# Patient Record
Sex: Female | Born: 1955
Health system: Southern US, Community
[De-identification: ages and names within clinical notes are randomized; demographics above are authoritative.]

## PROBLEM LIST (undated history)

## (undated) DIAGNOSIS — D219 Benign neoplasm of connective and other soft tissue, unspecified: Secondary | ICD-10-CM

## (undated) DIAGNOSIS — N200 Calculus of kidney: Secondary | ICD-10-CM

## (undated) DIAGNOSIS — IMO0002 Reserved for concepts with insufficient information to code with codable children: Secondary | ICD-10-CM

## (undated) DIAGNOSIS — K219 Gastro-esophageal reflux disease without esophagitis: Secondary | ICD-10-CM

## (undated) DIAGNOSIS — A630 Anogenital (venereal) warts: Secondary | ICD-10-CM

## (undated) DIAGNOSIS — N870 Mild cervical dysplasia: Secondary | ICD-10-CM

## (undated) HISTORY — DX: Calculus of kidney: N20.0

## (undated) HISTORY — PX: CERVICAL BIOPSY  W/ LOOP ELECTRODE EXCISION: SUR135

## (undated) HISTORY — DX: Mild cervical dysplasia: N87.0

## (undated) HISTORY — DX: Anogenital (venereal) warts: A63.0

## (undated) HISTORY — DX: Reserved for concepts with insufficient information to code with codable children: IMO0002

---

## 1989-11-15 HISTORY — PX: MYOMECTOMY: SHX85

## 1999-08-17 ENCOUNTER — Other Ambulatory Visit: Admission: RE | Admit: 1999-08-17 | Discharge: 1999-08-17 | Payer: Self-pay | Admitting: *Deleted

## 2000-02-10 ENCOUNTER — Other Ambulatory Visit: Admission: RE | Admit: 2000-02-10 | Discharge: 2000-02-10 | Payer: Self-pay | Admitting: *Deleted

## 2000-08-10 ENCOUNTER — Other Ambulatory Visit: Admission: RE | Admit: 2000-08-10 | Discharge: 2000-08-10 | Payer: Self-pay | Admitting: *Deleted

## 2000-08-15 ENCOUNTER — Encounter: Payer: Self-pay | Admitting: *Deleted

## 2000-08-15 ENCOUNTER — Encounter: Admission: RE | Admit: 2000-08-15 | Discharge: 2000-08-15 | Payer: Self-pay | Admitting: *Deleted

## 2001-02-06 ENCOUNTER — Other Ambulatory Visit: Admission: RE | Admit: 2001-02-06 | Discharge: 2001-02-06 | Payer: Self-pay | Admitting: *Deleted

## 2001-08-16 ENCOUNTER — Encounter: Payer: Self-pay | Admitting: *Deleted

## 2001-08-16 ENCOUNTER — Encounter: Admission: RE | Admit: 2001-08-16 | Discharge: 2001-08-16 | Payer: Self-pay | Admitting: *Deleted

## 2001-09-07 ENCOUNTER — Other Ambulatory Visit: Admission: RE | Admit: 2001-09-07 | Discharge: 2001-09-07 | Payer: Self-pay | Admitting: *Deleted

## 2001-11-03 ENCOUNTER — Encounter: Admission: RE | Admit: 2001-11-03 | Discharge: 2001-11-03 | Payer: Self-pay | Admitting: Endocrinology

## 2001-11-03 ENCOUNTER — Encounter: Payer: Self-pay | Admitting: Endocrinology

## 2002-08-17 ENCOUNTER — Encounter: Admission: RE | Admit: 2002-08-17 | Discharge: 2002-08-17 | Payer: Self-pay | Admitting: *Deleted

## 2002-08-17 ENCOUNTER — Encounter: Payer: Self-pay | Admitting: *Deleted

## 2002-09-10 ENCOUNTER — Other Ambulatory Visit: Admission: RE | Admit: 2002-09-10 | Discharge: 2002-09-10 | Payer: Self-pay | Admitting: *Deleted

## 2003-09-19 ENCOUNTER — Other Ambulatory Visit: Admission: RE | Admit: 2003-09-19 | Discharge: 2003-09-19 | Payer: Self-pay | Admitting: *Deleted

## 2003-10-01 ENCOUNTER — Encounter: Admission: RE | Admit: 2003-10-01 | Discharge: 2003-10-01 | Payer: Self-pay | Admitting: *Deleted

## 2003-11-16 DIAGNOSIS — N870 Mild cervical dysplasia: Secondary | ICD-10-CM

## 2003-11-16 HISTORY — DX: Mild cervical dysplasia: N87.0

## 2004-09-15 DIAGNOSIS — IMO0002 Reserved for concepts with insufficient information to code with codable children: Secondary | ICD-10-CM

## 2004-09-15 HISTORY — DX: Reserved for concepts with insufficient information to code with codable children: IMO0002

## 2004-09-23 ENCOUNTER — Other Ambulatory Visit: Admission: RE | Admit: 2004-09-23 | Discharge: 2004-09-23 | Payer: Self-pay | Admitting: *Deleted

## 2004-10-01 ENCOUNTER — Encounter: Admission: RE | Admit: 2004-10-01 | Discharge: 2004-10-01 | Payer: Self-pay | Admitting: *Deleted

## 2004-11-27 ENCOUNTER — Ambulatory Visit: Payer: Self-pay | Admitting: Internal Medicine

## 2005-05-12 ENCOUNTER — Other Ambulatory Visit: Admission: RE | Admit: 2005-05-12 | Discharge: 2005-05-12 | Payer: Self-pay | Admitting: *Deleted

## 2005-09-28 ENCOUNTER — Other Ambulatory Visit: Admission: RE | Admit: 2005-09-28 | Discharge: 2005-09-28 | Payer: Self-pay | Admitting: Obstetrics and Gynecology

## 2005-10-04 ENCOUNTER — Encounter: Admission: RE | Admit: 2005-10-04 | Discharge: 2005-10-04 | Payer: Self-pay | Admitting: Obstetrics and Gynecology

## 2006-02-15 ENCOUNTER — Ambulatory Visit: Payer: Self-pay | Admitting: Endocrinology

## 2006-09-19 ENCOUNTER — Ambulatory Visit: Payer: Self-pay | Admitting: Gastroenterology

## 2006-09-30 ENCOUNTER — Other Ambulatory Visit: Admission: RE | Admit: 2006-09-30 | Discharge: 2006-09-30 | Payer: Self-pay | Admitting: Obstetrics and Gynecology

## 2006-10-11 ENCOUNTER — Encounter: Admission: RE | Admit: 2006-10-11 | Discharge: 2006-10-11 | Payer: Self-pay | Admitting: Obstetrics and Gynecology

## 2007-08-05 ENCOUNTER — Encounter: Payer: Self-pay | Admitting: *Deleted

## 2007-08-05 DIAGNOSIS — D259 Leiomyoma of uterus, unspecified: Secondary | ICD-10-CM | POA: Insufficient documentation

## 2007-08-05 DIAGNOSIS — K219 Gastro-esophageal reflux disease without esophagitis: Secondary | ICD-10-CM | POA: Insufficient documentation

## 2007-10-06 ENCOUNTER — Other Ambulatory Visit: Admission: RE | Admit: 2007-10-06 | Discharge: 2007-10-06 | Payer: Self-pay | Admitting: Obstetrics and Gynecology

## 2007-10-17 ENCOUNTER — Encounter: Admission: RE | Admit: 2007-10-17 | Discharge: 2007-10-17 | Payer: Self-pay | Admitting: Obstetrics and Gynecology

## 2008-03-12 ENCOUNTER — Other Ambulatory Visit: Admission: RE | Admit: 2008-03-12 | Discharge: 2008-03-12 | Payer: Self-pay | Admitting: Obstetrics and Gynecology

## 2008-06-18 ENCOUNTER — Other Ambulatory Visit: Admission: RE | Admit: 2008-06-18 | Discharge: 2008-06-18 | Payer: Self-pay | Admitting: Obstetrics and Gynecology

## 2008-08-27 ENCOUNTER — Encounter: Payer: Self-pay | Admitting: Obstetrics and Gynecology

## 2008-08-27 ENCOUNTER — Inpatient Hospital Stay (HOSPITAL_COMMUNITY): Admission: RE | Admit: 2008-08-27 | Discharge: 2008-08-29 | Payer: Self-pay | Admitting: Obstetrics and Gynecology

## 2008-08-27 HISTORY — PX: TOTAL ABDOMINAL HYSTERECTOMY: SHX209

## 2008-10-29 ENCOUNTER — Encounter: Admission: RE | Admit: 2008-10-29 | Discharge: 2008-10-29 | Payer: Self-pay | Admitting: Obstetrics and Gynecology

## 2008-12-16 ENCOUNTER — Other Ambulatory Visit: Admission: RE | Admit: 2008-12-16 | Discharge: 2008-12-16 | Payer: Self-pay | Admitting: Obstetrics and Gynecology

## 2009-11-04 ENCOUNTER — Encounter: Admission: RE | Admit: 2009-11-04 | Discharge: 2009-11-04 | Payer: Self-pay | Admitting: Obstetrics and Gynecology

## 2010-08-28 ENCOUNTER — Ambulatory Visit: Payer: Self-pay | Admitting: Internal Medicine

## 2010-08-28 DIAGNOSIS — K432 Incisional hernia without obstruction or gangrene: Secondary | ICD-10-CM | POA: Insufficient documentation

## 2010-10-01 ENCOUNTER — Encounter: Payer: Self-pay | Admitting: Internal Medicine

## 2010-10-14 ENCOUNTER — Encounter: Admission: RE | Admit: 2010-10-14 | Discharge: 2010-10-14 | Payer: Self-pay | Admitting: General Surgery

## 2010-10-23 ENCOUNTER — Encounter: Payer: Self-pay | Admitting: Internal Medicine

## 2010-11-11 ENCOUNTER — Encounter
Admission: RE | Admit: 2010-11-11 | Discharge: 2010-11-11 | Payer: Self-pay | Source: Home / Self Care | Attending: Obstetrics and Gynecology | Admitting: Obstetrics and Gynecology

## 2010-12-15 NOTE — Assessment & Plan Note (Signed)
Summary: new pt/[employer]et[employer]/#/lb   Vital Signs:  Patient profile:   55 year old female Height:      67 inches Weight:      144.50 pounds BMI:     22.71 O2 Sat:      99 % on Room air Temp:     98.3 degrees F oral Pulse rate:   82 / minute BP sitting:   96 / 60  (left arm) Cuff size:   regular  Vitals Entered By: Zella Ball Ewing CMA Duncan Dull) (August 28, 2010 3:12 PM)  O2 Flow:  Room air  Preventive Care Screening  Colonoscopy:    Date:  11/16/2007    Results:  normal   CC: New Patient/RE   CC:  New Patient/RE.  History of Present Illness: here with ? hernia - has swollen area to right inguinal area, occasionally painful; tends to go down with lying flat and pushing or rubbing, and worse to standing up;  location is at the previous TAH scar.  Pt denies CP, worsening sob, doe, wheezing, orthopnea, pnd, worsening LE edema, palps, dizziness or syncope  Pt denies new neuro symptoms such as headache, facial or extremity weakness  Denies polyidpsia, polyuria  Preventive Screening-Counseling & Management  Alcohol-Tobacco     Smoking Status: never      Drug Use:  no.    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 father with colon polyp, died wtih CHF age 61, s?p MI brother with depression. ETOH, migraines aunt - ALS grandmother with stroke p-grandmother and 3 aunts iwth  breast cancer  Social History: Reviewed history and no changes required. 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   Head:  normocephalic and atraumatic.   Eyes:  vision grossly intact, pupils equal, and pupils round.   Ears:  R ear normal and L ear normal.   Nose:  no external deformity and no nasal discharge.   Mouth:  no gingival abnormalities and pharynx pink and moist.   Neck:  supple and no JVD.   Lungs:  normal respiratory effort and normal breath sounds.   Heart:  normal rate and regular rhythm.   Abdomen:  small 1-2 cm mild tender but easily reducible swollen area just above the low mid abd scar from previous hysterectomy Extremities:  no edema, no erythema    Impression & Recommendations:  Problem # 1:  INCISIONAL HERNIA (ICD-553.21) small in size and reducible but tender - for referral to gen surgury Orders: Surgical Referral (Surgery)  Patient Instructions: 1)  You will be contacted about the referral(s) to: General Surgury 2)  Please schedule a follow-up appointment in 6 months with CPX labs

## 2010-12-15 NOTE — Consult Note (Signed)
Summary: [facility]   Imported By: Lennie Odor 10/21/2010 11:47:52  _____________________________________________________________________  External Attachment:    Type:   Image     Comment:   External Document

## 2010-12-17 NOTE — Letter (Signed)
Summary: [facility]   Imported By: Sherian Rein 11/02/2010 12:08:19  _____________________________________________________________________  External Attachment:    Type:   Image     Comment:   External Document

## 2011-03-30 NOTE — Discharge Summary (Signed)
[name], [name]               ACCOUNT NO.:  [id number]   MEDICAL RECORD NO.:  [id number]          PATIENT TYPE:  INP   LOCATION:  1524                         FACILITY:  [facility]   PHYSICIAN:  Cynthia P. Romine, M.D.DATE OF BIRTH:  19-Jan-1956   DATE OF ADMISSION:  08/27/2008  DATE OF DISCHARGE:  08/29/2008                               DISCHARGE SUMMARY   DISCHARGE DIAGNOSES:  1. Recurrent cervical intraepithelial neoplasia.  2. Fibroids with menorrhagia.   HISTORY:  This is a 55 year old married white female gravida 0, para 0,  who originally had mild dysplasia in 2005 that was managed  conservatively, but then reoccurred on a path in November 2008 and  subsequent colposcopy she was found to have CIN-2.  She therefore  underwent LEEP procedure in November 24, 2007 which showed the CIN-3 with  clear margins.  Following her LEEP she had 2 subsequent normal Pap, but  recurrent ASCUS with positive high-risk HPV path came back in August  2009 and that combined with her known fibroids and menorrhagia caused  the patient to request definitive surgery for the menorrhagia and  hopefully definitive surgery for the recurrent dysplasia.   HOSPITAL COURSE:  On August 27, 2008 she was admitted and attempted a  robotic total laparoscopic hysterectomy, it was abandoned because the  cervix had been so foreshortened from her previous LEEP that the coring  would not sit on to the cervix.  There was however felt that it was  worth at least a laparoscopy to be sure that the colpotomy incision  could be done robotically with the coring as it sat.  She had  laparoscopy, there was very poor visibility of the uterus and  manipulation of the uterus was also very difficult and it was felt to be  on unwise to persist and attempts of robotic hysterectomy, therefore the  hysterectomy was converted to an abdominal hysterectomy.  This went  without complication.   ESTIMATED BLOOD LOSS:  200 mL.   DISPOSITION:  Postoperatively she did very well, and was sent home  afebrile and in good condition on postoperative day #2.   DISCHARGE INSTRUCTIONS:  She was given full discharge instructions  regarding pelvic rest and follow up in 4 weeks and discharge  prescription for Percocet.   PATHOLOGY:  Pathology did reveal focal low grade CIN-1 with no residual  high-grade squamous intraepithelial lesion and multiple fibroids.      Cynthia P. Romine, M.D.  Electronically Signed     CPR/MEDQ  D:  12/03/2008  T:  12/04/2008  Job:  [id number]

## 2011-03-30 NOTE — Op Note (Signed)
[name], [name]               ACCOUNT NO.:  [id number]   MEDICAL RECORD NO.:  [id number]          PATIENT TYPE:  AMB   LOCATION:  DAY                          FACILITY:  [facility]   PHYSICIAN:  Cynthia P. Romine, M.D.DATE OF BIRTH:  Jul 25, 1956   DATE OF PROCEDURE:  08/27/2008  DATE OF DISCHARGE:                               OPERATIVE REPORT   PREOPERATIVE DIAGNOSIS:  Recurrent cervical intraepithelial neoplasia,  fibroids and menorrhagia.   POSTOPERATIVE DIAGNOSES:  Recurrent cervical intraepithelial neoplasia,  fibroids and menorrhagia, path pending.   PROCEDURE:  Attempt at robotic laparoscopic hysterectomy, conversion to  open total abdominal hysterectomy.   Cynthia P. Romine, M.D.   ASSISTANT:  Dr. Leda Quail.   ANESTHESIA:  General endotracheal.   ESTIMATED BLOOD LOSS:  200 mL.   COMPLICATIONS:  None.   PROCEDURE:  The patient was taken to the operating room and after  induction of adequate general endotracheal anesthesia was placed in the  dorsal lithotomy position and prepped and draped in the usual fashion in  preparation for robotic surgery. A posterior weighted and anterior Sims  retractor were placed.  The cervix was noted to be foreshortened from  the previous LEEP and her vagina was very narrow. It was very difficult  to the get a Koh ring even to pass the introitus. After the cervix was  grasped with a single-tooth tenaculum on its anterior lip it was sounded  to 8 cm and dilated to a #23 Pratt.  An 8 cm Rumi was attached to the  handle and a medium Koh ring was put on and this would not pass through  the introitus.  The small Koh ring was then tried and with great  difficulty to be passed through the introitus but there was very little  cervix for it to surrounding and it was extraordinarily difficult to get  the Coolidge ring around the cervix and the manipulator into the uterus.  After multiple unsuccessful attempts by the surgeon, the assistant  surgeon  also tried and was unable to get a Rumi manipulator into the  cervix.  We then tried the [employer] and a small Koh ring and  with great effort this was able to be placed inside the vagina. Because  the cervix was so short the Tolna ring did not attach firmly to the  cervix.  However, the uterus did manipulate well and it was felt that we  should at least look in the abdomen with the laparoscope and see what we  thought about the ease of the colpotomy incision if this hysterectomy  was done robotically. Therefore attention was next turned to the  abdomen.  A small incision was made just above the umbilicus and carried  down to the fascia with sharp and blunt dissection.  The fascia was  elevated with Kocher's and entered. The underlying peritoneum was  entered atraumatically.  Hasson trocar was placed.  Pneumoperitoneum was  created with the automatic insufflator using CO2 and the robotic  laparoscope was introduced. Attention was first looked posteriorly in  the posterior cul-de-sac and it was felt that  the [facility] ring was visible  when the uterus was tilted anteriorly and that a posterior colpotomy  would be possible. Anteriorly for the cervix had been shorter.  We could  not see the [facility] ring and there was an anterior fibroid in the corpus  that also inhibited view of the cervix with the laparoscope.  Because of  poor visibility anteriorly and being unable to trust where the [facility] ring  was placed anteriorly in an effort to not have any surgical  complications from attempting a colpotomy when the [facility] ring was felt to  be unsatisfactory and delineating the cul-de-sac.  It was felt that open  hysterectomy was the best choice.  Therefore the laparoscope was  removed.  The fascia was closed around the umbilical incision.  The  umbilical incision was closed in the skin using 4-0 Vicryl Rapide.  A  Pfannenstiel incision was then made at the site of her previous  Pfannenstiel, carried down to  the fascia using Bovie.  The fascia was  nicked and opened transversely with Mayo scissors.  Kocher's were used  to grasp the fascial margins and with membranes separated and the rectus  muscles with sharp dissection and the Bovie.  Rectus muscles were  separated bluntly in the midline.  The underlying peritoneum was entered  atraumatically and extended vertically. The abdomen was explored.  The  liver edge was smooth.  The gallbladder was quite distended but  contained no stones.  There was no adenopathy in the pelvis. The tubes  and ovaries appeared normal.  The uterus had and anterior fibroid in the  corpus approximately 3 cm and the fundal fibroid approximately 4 cm.  A  Balfour retractor was placed.  The bowel was packed away with wet laps.  The uterus was elevated with long Kelly's at the cornu.  The round  ligaments were sutured with zero Vicryl and then divided with Bovie.  The anterior leaf of broad ligament was taken down sharply as was  posteriorly leaf.  The bladder was taken down sharply.  The uterine  arteries were skeletonized.  A window was created in the mesosalpinx and  the pedicle containing the utero-ovarian ligament and the tube was  clamped, cut and doubly tied the zero Vicryl on each side.  The uterine  arteries were then clamped, cut and doubly tied on each side.  The  hysterectomy continued down the cardinal ligament with straight Heaney  clamp, clamping, cutting and tying with zero Vicryl in sequence.  The  uterosacral ligaments were clamped with curved Heaney, cut and the  vagina was entered.  The pedicles containing the uterosacral ligaments  were held.  Specimen was removed with Jorgenson scissors.  The vaginal  margins were grasped with Kocher's.  Angle sutures were placed on the  right and left angles of the vagina.  The vaginal mucosa was then closed  with two interrupted sutures of zero Vicryl.  The pelvis was irrigated.  There were some small bleeders on  the peritoneal edges, otherwise the  pelvis was hemostatic.  The peritoneal edges were briefly cauterized  with the Bovie to achieve excellent hemostasis.  The ovaries were tied  up to the round ligament on each side.  The bowel was allowed to return  to its anatomic position.  The packs were removed.  The peritoneum was  closed with a running suture of 2-0 Vicryl.  A subfascial On-Q catheter  was placed percutaneously from the patient's right. The fascia was then  closed with a running suture of zero Vicryl running from each angle to  the midline.  Hemostasis was achieved with subcu tissue with the Bovie.  Percutaneous On-Q subcutaneous catheter was placed.  The skin was then  closed with a running suture of 4-0 Vicryl Rapid, Benzoin and Steri-  Strips.  An On-Q pump was primed with 10 mL of 1% Xylocaine into the  subfascial catheter and 5 mL of 1% Xylocaine into the subcutaneous  catheter.  It then was hooked to the On-Q pump which contained 0.25%  Marcaine.  Packs and drains:  Foley.  Sponge and instrument counts  correct x3.      Cynthia P. Romine, M.D.  Electronically Signed     CPR/MEDQ  D:  08/27/2008  T:  08/27/2008  Job:  [id number]

## 2011-04-02 NOTE — Assessment & Plan Note (Signed)
[facility]                           GASTROENTEROLOGY OFFICE NOTE   NAME:[name] [name] STANDLEE                      MRN:          [id number]  DATE:09/19/2006                            DOB:          10-02-1956    REASON FOR REFERRAL:  Dr. Everardo All asked me to evaluate [name] [name] regarding  question GERD symptoms.   HISTORY OF PRESENT ILLNESS:  [name] [name] is a very pleasant 55 year old  woman who has had at least 3-4 years of acid taste in her mouth, burning in  her chest especially after meals and when lying down at night.  She also  complains of belching a bit more frequently recently.  She tried Prilosec  for this 2-3 years ago, she took it for about a year.  She was taking it  approximately 1 hour before breakfast, did not notice a tremendous amount of  difference at that point.  More recently, she has been swallowing aloe vera  gel which she does believe helps her symptoms.   She has noticed certain foods such as onions, tomatoes, and spicy foods do  cause an upset stomach and maybe a worsening of this burning in her abdomen  and chest.   REVIEW OF SYSTEMS:  Notable for no GI bleeding, no dysphagia, and stable  weight.  The rest of her review of systems is essentially normal and is  available on her nursing intake sheet.   PAST MEDICAL HISTORY:  In 1990, fibroid uterine surgery.   CURRENT MEDICINES:  Birth control pills.   ALLERGIES:  No known drug allergies.   SOCIAL HISTORY:  Married, two step-children, nonsmoker, drinks alcohol  occasionally.   FAMILY HISTORY:  Father with colon polyps, no colon cancer in family.   PHYSICAL EXAMINATION:  VITAL SIGNS:  Weight 146 pounds, blood pressure  96/60, pulse 80.  CONSTITUTIONAL:  Generally well-appearing.  NEUROLOGIC:  Alert and oriented x3.  EYES:  Extraocular movements intact.  MOUTH:  Oropharynx moist, no lesions.  NECK:  Supple, no lymphadenopathy.  CARDIOVASCULAR:  Heart regular rate  and rhythm.  LUNGS:  Clear to auscultation bilaterally.  ABDOMEN:  Soft, nontender, nondistended, normal bowel sounds.  EXTREMITIES:  No lower extremity edema.  SKIN:  No rashes or lesions on visible extremities.   ASSESSMENT AND PLAN:  A 55 year old woman with likely gastroesophageal  reflux disease, family history of colon polyps.   Her symptoms do seem fairly classic for GERD.  She did try a PPI in the past  but probably was not taking it in correct relation with her food and so I  have recommended she begin taking Protonix 40 mg once daily.  She will take  this 20-30 minutes prior to her dinner meal, which is a very reliable decent-  sized meal for her.  I have also given her a GERD handout on foods to avoid.  I did recommend that we proceed with upper endoscopy since she has had this  symptom for 3-4 years, I think it is reasonable to screen her for Barrett's.  At the same time, I think proceeding with screening colonoscopy since  she  has a family history of colon polyps.  She prefers to wait on the  colonoscopy for now and also wanted to see how she does over the next couple  of months with renewed PPI.  If at that time in 2 months from otherwise she  is still bothered by symptoms then I think we certainly should proceed with  upper endoscopy and since she will be undergoing sedation, colonoscopy seems  reasonable at the same time.  She will get a CBC today and certainly, if she  is anemic, we may have to rethink these.    ______________________________  Rachael Fee, MD    DPJ/MedQ  DD: 09/19/2006  DT: 09/19/2006  Job #: [id number]   cc:   Gregary Signs A. Everardo All, MD

## 2011-08-17 LAB — CBC
HCT: 33.3 — ABNORMAL LOW
HCT: 41.9
Hemoglobin: 11.4 — ABNORMAL LOW
Hemoglobin: 14
MCHC: 33.5
MCHC: 34.3
MCV: 93
MCV: 93.7
Platelets: 199
Platelets: 201
RBC: 3.58 — ABNORMAL LOW
RBC: 4.47
RDW: 12.8
RDW: 12.8
WBC: 11.8 — ABNORMAL HIGH
WBC: 4.9

## 2011-08-17 LAB — HCG, SERUM, QUALITATIVE: Preg, Serum: NEGATIVE

## 2011-08-17 LAB — GLUCOSE, CAPILLARY: Glucose-Capillary: 162 — ABNORMAL HIGH

## 2011-10-06 ENCOUNTER — Other Ambulatory Visit: Payer: Self-pay | Admitting: Obstetrics and Gynecology

## 2011-10-06 DIAGNOSIS — Z1231 Encounter for screening mammogram for malignant neoplasm of breast: Secondary | ICD-10-CM

## 2011-11-24 ENCOUNTER — Ambulatory Visit
Admission: RE | Admit: 2011-11-24 | Discharge: 2011-11-24 | Disposition: A | Discharge status: Home / Self Care | Payer: Self-pay | Source: Ambulatory Visit | Attending: Obstetrics and Gynecology | Admitting: Obstetrics and Gynecology

## 2011-11-24 DIAGNOSIS — Z1231 Encounter for screening mammogram for malignant neoplasm of breast: Secondary | ICD-10-CM

## 2012-10-25 ENCOUNTER — Other Ambulatory Visit: Payer: Self-pay | Admitting: Internal Medicine

## 2012-10-25 DIAGNOSIS — Z1231 Encounter for screening mammogram for malignant neoplasm of breast: Secondary | ICD-10-CM

## 2012-11-16 ENCOUNTER — Encounter (HOSPITAL_BASED_OUTPATIENT_CLINIC_OR_DEPARTMENT_OTHER): Payer: Self-pay | Admitting: *Deleted

## 2012-11-16 ENCOUNTER — Emergency Department (HOSPITAL_BASED_OUTPATIENT_CLINIC_OR_DEPARTMENT_OTHER)
Admission: EM | Admit: 2012-11-16 | Discharge: 2012-11-16 | Disposition: A | Discharge status: Home / Self Care | Payer: Managed Care, Other (non HMO) | Attending: Emergency Medicine | Admitting: Emergency Medicine

## 2012-11-16 DIAGNOSIS — Z79899 Other long term (current) drug therapy: Secondary | ICD-10-CM | POA: Insufficient documentation

## 2012-11-16 DIAGNOSIS — R55 Syncope and collapse: Secondary | ICD-10-CM

## 2012-11-16 DIAGNOSIS — R112 Nausea with vomiting, unspecified: Secondary | ICD-10-CM | POA: Insufficient documentation

## 2012-11-16 DIAGNOSIS — R197 Diarrhea, unspecified: Secondary | ICD-10-CM

## 2012-11-16 LAB — COMPREHENSIVE METABOLIC PANEL
ALT: 15 U/L (ref 0–35)
Albumin: 3.8 g/dL (ref 3.5–5.2)
BUN: 15 mg/dL (ref 6–23)
CO2: 23 mEq/L (ref 19–32)
Calcium: 8.7 mg/dL (ref 8.4–10.5)
Chloride: 103 mEq/L (ref 96–112)
Creatinine, Ser: 0.9 mg/dL (ref 0.50–1.10)
GFR calc Af Amer: 82 mL/min — ABNORMAL LOW (ref >90)
GFR calc non Af Amer: 71 mL/min — ABNORMAL LOW (ref >90)
Glucose, Bld: 152 mg/dL — ABNORMAL HIGH (ref 70–99)
Potassium: 3.5 mEq/L (ref 3.5–5.1)
Sodium: 139 mEq/L (ref 135–145)
Total Bilirubin: 0.5 mg/dL (ref 0.3–1.2)

## 2012-11-16 LAB — TROPONIN I: Troponin I: <0.3 ng/mL (ref <0.30)

## 2012-11-16 LAB — CBC WITH DIFFERENTIAL/PLATELET
Basophils Absolute: 0 10*3/uL (ref 0.0–0.1)
Basophils Relative: 1 % (ref 0–1)
Eosinophils Absolute: 0.1 10*3/uL (ref 0.0–0.7)
Hemoglobin: 13.9 g/dL (ref 12.0–15.0)
Lymphocytes Relative: 25 % (ref 12–46)
MCHC: 33.7 g/dL (ref 30.0–36.0)
Monocytes Absolute: 0.6 10*3/uL (ref 0.1–1.0)
Monocytes Relative: 9 % (ref 3–12)
Neutro Abs: 4.4 10*3/uL (ref 1.7–7.7)
Neutrophils Relative %: 63 % (ref 43–77)
Platelets: 218 10*3/uL (ref 150–400)
RBC: 4.39 MIL/uL (ref 3.87–5.11)
RDW: 13 % (ref 11.5–15.5)
WBC: 6.9 10*3/uL (ref 4.0–10.5)

## 2012-11-16 MED ORDER — ONDANSETRON HCL 4 MG/2ML IJ SOLN
4.0000 mg | Freq: Once | INTRAMUSCULAR | Status: AC
  Administered 2012-11-16: 4 mg via INTRAVENOUS
  Filled 2012-11-16: qty 2

## 2012-11-16 MED ORDER — SODIUM CHLORIDE 0.9 % IV BOLUS (SEPSIS)
1000.0000 mL | Freq: Once | INTRAVENOUS | Status: AC
  Administered 2012-11-16: 1000 mL via INTRAVENOUS

## 2012-11-16 NOTE — ED Provider Notes (Signed)
History     CSN: [id number]  Arrival date & time 11/16/12  0911   First MD Initiated Contact with Patient 11/16/12 [phone]      Chief Complaint  Patient presents with  . Loss of Consciousness  . Emesis    (Consider location/radiation/quality/duration/timing/severity/associated sxs/prior treatment) HPI Patient at work this a.m. Then felt light headed, then felt urge to have bowel movement, had bowel movement, became light headed and passed out on commode and fell back hitting wall then was awake.  Coworker crawled under stall, then patient began vomiting food.  Vomited 7-8 times.  EMS arrived and gave zofran iv patient feels improved but still has some nausea and vomited 1-2 times here.  Continues to feel weak.  Patient in normal health yesterday.  Several weeks patient has had some lower abdominal tenderness.  Normal bowel movements prior to this a.m., no weight change, no fever, cough, headache, chest pain.  pmd Dr. Jonny Ruiz History reviewed. No pertinent past medical history. none History reviewed. No pertinent past surgical history. S/p hysterectom History reviewed. No pertinent family history.  History  Substance Use Topics  . Smoking status: Not on file  . Smokeless tobacco: Not on file  . Alcohol Use: Not on file    OB History    Grav Para Term Preterm Abortions TAB SAB Ect Mult Living                  Review of Systems  Constitutional: Negative.  Negative for fever and unexpected weight change.  HENT: Negative.   Eyes: Negative.   Respiratory: Negative.   Cardiovascular: Negative.   Gastrointestinal: Positive for nausea and diarrhea. Negative for blood in stool and anal bleeding.  Genitourinary: Negative.   Musculoskeletal: Negative.   Skin: Negative.   Neurological: Negative.   Hematological: Negative.   Psychiatric/Behavioral: Negative.     Allergies  Review of patient's allergies indicates no known allergies.  Home Medications   Current Outpatient Rx  Name   Route  Sig  Dispense  Refill  . ESTRADIOL 0.0375 MG/24HR TD PTWK   Transdermal   Place 1 patch onto the skin once a week.           BP 92/67  Pulse 72  Temp 97.6 F (36.4 C) (Oral)  Resp 16  Ht 5\' 7"  (1.702 m)  Wt 147 lb (66.679 kg)  BMI 23.02 kg/m2  SpO2 96%  Physical Exam  Nursing note and vitals reviewed. Constitutional: She appears well-developed and well-nourished.  HENT:  Head: Normocephalic and atraumatic.  Eyes: Conjunctivae normal and EOM are normal. Pupils are equal, round, and reactive to light.  Neck: Normal range of motion. Neck supple.  Cardiovascular: Normal rate, regular rhythm, normal heart sounds and intact distal pulses.   Pulmonary/Chest: Effort normal and breath sounds normal.  Abdominal: Soft. Bowel sounds are normal.  Musculoskeletal: Normal range of motion.  Neurological: She is alert.  Skin: Skin is warm and dry.  Psychiatric: She has a normal mood and affect. Thought content normal.    ED Course  Procedures (including critical care time)   Labs Reviewed  CBC WITH DIFFERENTIAL  COMPREHENSIVE METABOLIC PANEL  TROPONIN I  URINALYSIS, ROUTINE W REFLEX MICROSCOPIC   No results found.      Date: 11/16/2012  Rate: 62  Rhythm: normal sinus rhythm  QRS Axis: normal  Intervals: normal  ST/T Wave abnormalities: normal  Conduction Disutrbances: none  Narrative Interpretation: unremarkable     MDM  Patient states she  has had syncopal episodes consistent with fainting in the past. She became nauseated had vomiting and diarrhea followed by a syncopal episode today. And fever here. She feels greatly improved. She has a normal EKG and normal labs with the exception of a blood sugar of 152. She is to advise regarding this abnormality and will have that rechecked fasting with her primary care Dr.     Hilario Quarry, MD 11/16/12 1124

## 2012-11-16 NOTE — ED Notes (Signed)
Pt to room 5 by ems via stretcher. Able to move self from stretcher to bed, per ems pt had syncopal episode while using the bathroom at work along with vomiting. Iv est pta, zofran 4mg  administered by ems. Pt reports relief of nausea with zofran, states "I just feel lightheaded and weak..." denies any pain.

## 2012-11-30 ENCOUNTER — Emergency Department (HOSPITAL_COMMUNITY): Payer: Managed Care, Other (non HMO)

## 2012-11-30 ENCOUNTER — Emergency Department (HOSPITAL_COMMUNITY)
Admission: EM | Admit: 2012-11-30 | Discharge: 2012-11-30 | Disposition: A | Discharge status: Home / Self Care | Payer: Managed Care, Other (non HMO) | Attending: Emergency Medicine | Admitting: Emergency Medicine

## 2012-11-30 ENCOUNTER — Encounter (HOSPITAL_COMMUNITY): Payer: Self-pay | Admitting: *Deleted

## 2012-11-30 DIAGNOSIS — E876 Hypokalemia: Secondary | ICD-10-CM | POA: Insufficient documentation

## 2012-11-30 DIAGNOSIS — R0789 Other chest pain: Secondary | ICD-10-CM | POA: Diagnosis present

## 2012-11-30 DIAGNOSIS — R072 Precordial pain: Secondary | ICD-10-CM

## 2012-11-30 DIAGNOSIS — R071 Chest pain on breathing: Secondary | ICD-10-CM | POA: Insufficient documentation

## 2012-11-30 DIAGNOSIS — K219 Gastro-esophageal reflux disease without esophagitis: Secondary | ICD-10-CM

## 2012-11-30 HISTORY — DX: Gastro-esophageal reflux disease without esophagitis: K21.9

## 2012-11-30 HISTORY — DX: Benign neoplasm of connective and other soft tissue, unspecified: D21.9

## 2012-11-30 LAB — AMYLASE: Amylase: 102 U/L (ref 0–105)

## 2012-11-30 LAB — CBC
HCT: 40.9 % (ref 36.0–46.0)
Hemoglobin: 14.2 g/dL (ref 12.0–15.0)
MCHC: 34.7 g/dL (ref 30.0–36.0)
WBC: 6.1 10*3/uL (ref 4.0–10.5)

## 2012-11-30 LAB — LIPASE, BLOOD: Lipase: 34 U/L (ref 11–59)

## 2012-11-30 LAB — BASIC METABOLIC PANEL
BUN: 11 mg/dL (ref 6–23)
CO2: 25 mEq/L (ref 19–32)
Chloride: 103 mEq/L (ref 96–112)
Glucose, Bld: 84 mg/dL (ref 70–99)
Potassium: 3.3 mEq/L — ABNORMAL LOW (ref 3.5–5.1)

## 2012-11-30 LAB — POCT I-STAT TROPONIN I

## 2012-11-30 LAB — TROPONIN I: Troponin I: <0.3 ng/mL (ref <0.30)

## 2012-11-30 MED ORDER — FAMOTIDINE 20 MG PO TABS
40.0000 mg | ORAL_TABLET | Freq: Once | ORAL | Status: AC
  Administered 2012-11-30: 40 mg via ORAL
  Filled 2012-11-30: qty 2

## 2012-11-30 MED ORDER — GI COCKTAIL ~~LOC~~
30.0000 mL | Freq: Once | ORAL | Status: AC
  Administered 2012-11-30: 30 mL via ORAL
  Filled 2012-11-30: qty 30

## 2012-11-30 MED ORDER — ASPIRIN 81 MG PO CHEW
324.0000 mg | CHEWABLE_TABLET | Freq: Once | ORAL | Status: DC
  Filled 2012-11-30: qty 3

## 2012-11-30 MED ORDER — ASPIRIN 81 MG PO CHEW
243.0000 mg | CHEWABLE_TABLET | Freq: Once | ORAL | Status: AC
  Administered 2012-11-30: 243 mg via ORAL

## 2012-11-30 MED ORDER — POTASSIUM CHLORIDE CRYS ER 20 MEQ PO TBCR
40.0000 meq | EXTENDED_RELEASE_TABLET | Freq: Once | ORAL | Status: AC
  Administered 2012-11-30: 40 meq via ORAL
  Filled 2012-11-30: qty 2

## 2012-11-30 MED ORDER — FAMOTIDINE 20 MG PO TABS: 20.0000 mg | ORAL_TABLET | Freq: Two times a day (BID) | ORAL | Status: DC

## 2012-11-30 NOTE — ED Notes (Signed)
Patient transported to X-ray 

## 2012-11-30 NOTE — Consult Note (Signed)
CARDIOLOGY CONSULT NOTE   Patient ID: [name] MRN: [id number] DOB/AGE: 03-07-1956 57 y.o.  Admit date: 11/30/2012  Primary Physician   Oliver Barre, MD Primary Cardiologist   None Reason for Consultation   Chest pain  YQM:VHQIO [name] is a 57 y.o. female with no prior history of CAD. She has been having chest tightness for the last week. She states the pain at worst is a 4/10 but is constantly at least at a 1/10. She reports the pain radiates to her left arm. She states that eating seems to relieve some of the pain. She also reports sitting upright helps alleviate the pain. She denies increased pain on exertion or dyspnea. She rates the pain was still just 1/10 during her water aerobics class and during other exercise over the last week. She reports taking an ASA once at night 5 days ago to help relieve the pain. She denies any nausea or vomiting.  The pain awoke her this morning, and was the worst that it has been (4/10). She also reports having some heartburn and this morning. She came to the ER and had ASA 243 and Kdur 40 meq. Currently, her pain is a 1/10.   Past Medical History  Diagnosis Date  . GERD (gastroesophageal reflux disease)   . Fibroids      Past Surgical History  Procedure Date  . Total abdominal hysterectomy     No Known Allergies  I have reviewed the patient's current medications Prior to Admission medications   Medication Sig Start Date End Date Taking? Authorizing Provider  estradiol (CLIMARA - DOSED IN MG/24 HR) 0.0375 mg/24hr Place 1 patch onto the skin once a week.   Yes Historical Provider, MD     History   Social History  . Marital Status: Married    Spouse Name: N/A    Number of Children: N/A  . Years of Education: N/A   Occupational History  . SOFTWARE SUPPORT    Social History Main Topics  . Smoking status: Never Smoker   . Smokeless tobacco: Not on file  . Alcohol Use: 0.5 oz/week    1 drink(s) per week  . Drug Use: No  .  Sexually Active: Not on file   Other Topics Concern  . Not on file   Social History Narrative   No siblings with heart problems. Works in Company secretary.    Family Status  Relation Status Death Age  . Mother Alive     in her 41s, takes fluid pills  . Father Deceased 4    MI at age 62     ROS:  Full 14 point review of systems complete and found to be negative unless listed [name].  Physical Exam: Blood pressure 102/72, pulse 68, temperature 97.9 F (36.6 C), temperature source Oral, resp. rate 11, SpO2 100.00%.  General: Well developed, well nourished, female in no acute distress Head: Eyes PERRLA, No xanthomas.   Normocephalic and atraumatic, oropharynx without edema or exudate. Dentition: good Lungs: Clear bilaterally  Heart: HRRR S1 S2, no rub/gallop, no murmur. pulses are 2+ all 4 extrem.   Neck: No carotid bruits. No lymphadenopathy.  JVD not elevated. Abdomen: Bowel sounds present, abdomen soft and non-tender without masses or hernias noted. Msk:  No spine or cva tenderness. No weakness, no joint deformities or effusions. Extremities: No clubbing or cyanosis.  edema.  Neuro: Alert and oriented X 3. No focal deficits noted. Psych:  Good affect, responds appropriately Skin: No rashes  or lesions noted.  Labs:   Lab Results  Component Value Date   WBC 6.1 11/30/2012   HGB 14.2 11/30/2012   HCT 40.9 11/30/2012   MCV 92.1 11/30/2012   PLT 207 11/30/2012     Lab 11/30/12 0435  NA 138  K 3.3*  CL 103  CO2 25  BUN 11  CREATININE 0.89  CALCIUM 9.2  PROT --  BILITOT --  ALKPHOS --  ALT --  AST --  GLUCOSE 84     Basename 11/30/12 0853  CKTOTAL --  CKMB --  TROPONINI <0.30    Basename 11/30/12 0527  TROPIPOC 0.00    Lab Results  Component Value Date   DDIMER 0.38 11/30/2012   ECG: 30-Nov-2012 04:34:08 [facility]/ED ROUTINE RECORD Normal sinus rhythm Normal ECG 68mm/s 20mm/mV 100Hz  8.0.1 12SL 241 HD CID: 4 Referred by:  Unconfirmed Vent. rate 69 BPM PR interval 190 ms QRS duration 78 ms QT/QTc 420/450 ms P-[name]-T axes 78 84 78   Radiology:  Dg Chest 2 View 11/30/2012  *RADIOLOGY REPORT*  Clinical Data: Chest pain for 1 week.  CHEST - 2 VIEW  Comparison: None.  Findings: The heart size and pulmonary vascularity are normal. The lungs appear clear and expanded without focal air space disease or consolidation. No blunting of the costophrenic angles.  No pneumothorax.  Mediastinal contours appear intact.  IMPRESSION: No evidence of active pulmonary disease.   Original Report Authenticated By: Burman Nieves, M.D.     ASSESSMENT AND PLAN:   The patient was seen today by Dr Tenny Craw,  the patient evaluated and the data reviewed.  Principal Problem:  *Chest pain, mid sternal - atypical and possibly improved by PO intake, no association with exertion. No abdominal tenderness. Will check amylase and lipase, try GI cocktail, Pepcid. If labs OK and meds help, consider d/c home, to f/u as an outpatient.   Signed: [name] 11/30/2012, 1:09 PM Co-Sign MD  Patient seen and examined  Agree with findings of [name]. Patient has approx 1 week history of CP  Pain has been rel continuous at a very low level (1/10)  Has intermitt exacerbations that go to a 4/10  Exacerbations are not associated with SOB  Do not occur when she is doing physical activity  Improve when she eats. She did two work out sessions this week and actually felt better during these periods She does have a history of GERD  Takes Pepcid occasionally.  On exam:  Lung: CTA Cardiac:  RRR  Chest Nontender  Abdomen: benign Ext no edema  EKG with nonspecific ST changes in V1/V2 that have been there in past.  Impr:  Chest pain  Prob noncardiac, possiblY GI  Will check another troponin.  Check amylase and lipase.  Add GI cocktail and Pepcid BID  Continue ECASA 81 mg.   If blood work neg plan d/c from ER with  F/U in clinic to follow response.  Pt has  seen Arty Baumgartner in past  2.  Syncope  Sounds more vagal.  Had vomiting and diarrhea which may have exacerbated. Follow.  [name] 2:40 PM

## 2012-11-30 NOTE — ED Provider Notes (Signed)
History     CSN: [id number]  Arrival date & time 11/30/12  4098   First MD Initiated Contact with Patient 11/30/12 [phone]      Chief Complaint  Patient presents with  . Chest Pain    (Consider location/radiation/quality/duration/timing/severity/associated sxs/prior treatment) HPI [name] 57 year old female who presents the emergency department with chief complaint of left-sided chest pain.  Onset of her pain was one week.  She describes it as dull, aching, intermittent, pressure-like with radiation to the left shoulder.  Patient denies any recent heavy lifting or muscle strain.  Patient denies any upper respiratory symptoms.  Patient was seen and evaluated 14 days ago for a syncopal episode that occurred at work.  She passed out and woke up with vomiting.  She has had several episodes of syncope previously in her life and attributed this to anemia as a child.  The patient has never had cardiac evaluation.  She denies history of racing or skipping heart.  Patient is nondiabetic, no history of hypertension, no history of smoking.  She is not overweight or obese.  Family history of father with MI at age 55 and grandmother with stroke in her 86s.  Overall patient's risk factors appear to be minimal.  Patient has had resolution of her chest pain with administration of aspirin earlier this morning.  She also has a history of GERD which is untreated at this time.  Patient denies any associated nausea, vomiting, diaphoresis. History reviewed. No pertinent past medical history.  History reviewed. No pertinent past surgical history.  No family history on file.  History  Substance Use Topics  . Smoking status: Not on file  . Smokeless tobacco: Not on file  . Alcohol Use: Not on file    OB History    Grav Para Term Preterm Abortions TAB SAB Ect Mult Living                  Review of Systems Ten systems reviewed and are negative for acute change, except as noted in the HPI.  \ Allergies  Review of patient's allergies indicates no known allergies.  Home Medications   Current Outpatient Rx  Name  Route  Sig  Dispense  Refill  . ESTRADIOL 0.0375 MG/24HR TD PTWK   Transdermal   Place 1 patch onto the skin once a week.           BP 112/73  Pulse 70  Temp 97.9 F (36.6 C) (Oral)  SpO2 98%  Physical Exam Physical Exam  Nursing note and vitals reviewed. Constitutional: She is oriented to person, place, and time. She appears well-developed and well-nourished. No distress.  HENT:  Head: Normocephalic and atraumatic.  Eyes: Conjunctivae normal and EOM are normal. Pupils are equal, round, and reactive to light. No scleral icterus.  Neck: Normal range of motion.  Cardiovascular: Normal rate, regular rhythm and normal heart sounds.  Exam reveals no gallop and no friction rub.  No murmur heard. Pulmonary/Chest: Effort normal and breath sounds normal. No respiratory distress. No tenderness to palpation of the left chest wall or pectoral muscle. Abdominal: Soft. Bowel sounds are normal. She exhibits no distension and no mass. There is no tenderness. There is no guarding.  Neurological: She is alert and oriented to person, place, and time.  Skin: Skin is warm and dry. She is not diaphoretic.    ED Course  Procedures (including critical care time)  Labs Reviewed  BASIC METABOLIC PANEL - Abnormal; Notable for the following:  Potassium 3.3 (*)     GFR calc non Af Amer 72 (*)     GFR calc Af Amer 83 (*)     All other components within normal limits  CBC  D-DIMER, QUANTITATIVE  POCT I-STAT TROPONIN I   Dg Chest 2 View  11/30/2012  *RADIOLOGY REPORT*  Clinical Data: Chest pain for 1 week.  CHEST - 2 VIEW  Comparison: None.  Findings: The heart size and pulmonary vascularity are normal. The lungs appear clear and expanded without focal air space disease or consolidation. No blunting of the costophrenic angles.  No pneumothorax.  Mediastinal contours appear  intact.  IMPRESSION: No evidence of active pulmonary disease.   Original Report Authenticated By: Burman Nieves, M.D.     Date: 11/30/2012  Rate: 69  Rhythm: normal sinus rhythm  QRS Axis: normal  Intervals: normal  ST/T Wave abnormalities: normal  Conduction Disutrbances: none  Narrative Interpretation:   Old EKG Reviewed: No significant changes noted      1. Chest pain, mid sternal   2. Esophageal reflux       MDM  8:21 AM Filed Vitals:   11/30/12 0442  BP: 112/73  Pulse: 70  Temp: 97.9 F (36.6 C)    Patient here with complaint of chest pain.  I discussed the case with Dr. Ranae Palms.  Risk factors for acute coronary syndrome are low.  The patient does take estrogen replacement.  Her d-dimer however is negative and she denies any unilateral leg swelling, previous history of cancer, confinement, recent surgeries. Her labs show some mild hypokalemia.  Will obtain a second set of troponins.  Patient is comfortable and resting at this time.   4:06 PM BP 101/60  Pulse 67  Temp 98 F (36.7 C) (Oral)  Resp 16  SpO2 100%  I spoke with Theodore Demark PA from [facility].  Patient's labs are all negative.  She will be discharged with cardiac followup with our.  She will also be discharged with Pepcid 20 mg twice a day and follow up with her primary care physician.    Arthor Captain, PA-C 11/30/12 1606

## 2012-11-30 NOTE — ED Notes (Signed)
Pt arrived from via private car c/o intermittent CP that radiates to upper left arm. Pt denies diaphoresis, N/V, SOB and dizziness. CP exacerbated by nothing, CP eased by ASA. Last dose of ASA 81mg  15 minutes ago

## 2012-11-30 NOTE — ED Notes (Signed)
Pt st's she's on a hormone patch

## 2012-12-01 NOTE — ED Provider Notes (Signed)
Medical screening examination/treatment/procedure(s) were conducted as a shared visit with non-physician practitioner(s) and myself.  I personally evaluated the patient during the encounter   Loren Racer, MD 12/01/12 [phone]

## 2012-12-06 ENCOUNTER — Ambulatory Visit
Admission: RE | Admit: 2012-12-06 | Discharge: 2012-12-06 | Disposition: A | Discharge status: Home / Self Care | Payer: Managed Care, Other (non HMO) | Source: Ambulatory Visit | Attending: Internal Medicine | Admitting: Internal Medicine

## 2012-12-06 DIAGNOSIS — Z1231 Encounter for screening mammogram for malignant neoplasm of breast: Secondary | ICD-10-CM

## 2012-12-18 ENCOUNTER — Encounter: Payer: Managed Care, Other (non HMO) | Admitting: Internal Medicine

## 2013-09-13 ENCOUNTER — Encounter: Payer: Self-pay | Admitting: Certified Nurse Midwife

## 2013-09-13 ENCOUNTER — Ambulatory Visit (INDEPENDENT_AMBULATORY_CARE_PROVIDER_SITE_OTHER): Payer: Managed Care, Other (non HMO) | Admitting: Certified Nurse Midwife

## 2013-09-13 ENCOUNTER — Telehealth: Payer: Self-pay | Admitting: Internal Medicine

## 2013-09-13 ENCOUNTER — Telehealth: Payer: Self-pay | Admitting: Obstetrics and Gynecology

## 2013-09-13 VITALS — BP 108/72 | Temp 98.2°F | Resp 18 | Wt 151.0 lb

## 2013-09-13 DIAGNOSIS — N390 Urinary tract infection, site not specified: Secondary | ICD-10-CM

## 2013-09-13 DIAGNOSIS — R300 Dysuria: Secondary | ICD-10-CM

## 2013-09-13 LAB — POCT URINALYSIS DIPSTICK
Leukocytes, UA: NEGATIVE
pH, UA: >=9

## 2013-09-13 MED ORDER — NITROFURANTOIN MONOHYD MACRO 100 MG PO CAPS: 100.0000 mg | ORAL_CAPSULE | Freq: Two times a day (BID) | ORAL | Status: DC

## 2013-09-13 MED ORDER — FLUCONAZOLE 150 MG PO TABS: 150.0000 mg | ORAL_TABLET | Freq: Once | ORAL | Status: DC

## 2013-09-13 NOTE — Telephone Encounter (Signed)
Chief Complaint  Patient presents with   Appointment  pt has a uti and would like to be seen today if possible. i checked schedule and didn't see anything.

## 2013-09-13 NOTE — Telephone Encounter (Signed)
Return call to patient, VM has number confirmation, LM that appointment available at 4pm with Debbi. Please call back to confirm.

## 2013-09-13 NOTE — Telephone Encounter (Signed)
Ok to see me later today if open appt, or regina now, then make appt at a later date with me for "full re-establishment"

## 2013-09-13 NOTE — Telephone Encounter (Signed)
Debbi, scheduled for today at 4pm.

## 2013-09-13 NOTE — Progress Notes (Signed)
57 y.o. Married Caucasian female G0P0000 here with complaint of urinary frequency and urgency for 2-3 days.Denies fever, chills or headache. Complaining of some backache on right side. Patient also has treated self for yeast with OTC one day which symptoms have resolved.. Patient has been off antibiotics for 1 week, used for root canal. No other health issues. No history of kidney stones.   O: Healthy WD,WN female Affect: normal, orientation x 3 Skin:Warm and dry Abdomen:Positive suprapubic CVAT positive on right Pelvic exam:EXTERNAL GENITALIA: normal appearing vulva with no masses, tenderness or lesions Bladder, urethra, urethral meatus all tender and meatus red VAGINA: no abnormal discharge or lesions and medication residue noted CERVIX:  absent UTERUS: absent ADNEXA: no masses palpable, non tender  RECTUM: exam not indicated,   POCT urine: Blood present  A:UTI Yeast vaginitis? Treated with OTC, no wet prep taken    P: Discussed findings of UTI and need for treatment Rx Macrobid see order. Increase water intake and decrease soda, tea, coffee. Rx Diflucan see order  Patient will take if symptoms persist  Labs Urine culture/micro  RV prn

## 2013-09-13 NOTE — Telephone Encounter (Signed)
Pt request to reestablish, last ov 08/2010. Pt stated that she has UTI and need to been seen for this problem. Please advise. Pt has Community education officer for insurance.

## 2013-09-13 NOTE — Telephone Encounter (Signed)
Pt went to her OBGYN today.  She made an appt in Dec. To re-est and physical.

## 2013-09-13 NOTE — Telephone Encounter (Signed)
Patient called and confirmed the appt at 4 today with debbi

## 2013-09-13 NOTE — Telephone Encounter (Signed)
Ok , thanks, will let Harriett Sine know

## 2013-09-14 LAB — URINALYSIS, MICROSCOPIC ONLY
Bacteria, UA: NONE SEEN
Crystals: NONE SEEN

## 2013-09-14 NOTE — Progress Notes (Signed)
Note reviewed, agree with plan.  Keirstan Iannello, MD  

## 2013-09-18 NOTE — Progress Notes (Signed)
agree

## 2013-09-19 ENCOUNTER — Other Ambulatory Visit: Payer: Self-pay | Admitting: Certified Nurse Midwife

## 2013-09-19 DIAGNOSIS — N390 Urinary tract infection, site not specified: Secondary | ICD-10-CM

## 2013-09-21 ENCOUNTER — Telehealth: Payer: Self-pay | Admitting: Certified Nurse Midwife

## 2013-09-21 ENCOUNTER — Ambulatory Visit (INDEPENDENT_AMBULATORY_CARE_PROVIDER_SITE_OTHER): Payer: Managed Care, Other (non HMO) | Admitting: Certified Nurse Midwife

## 2013-09-21 ENCOUNTER — Encounter: Payer: Self-pay | Admitting: Certified Nurse Midwife

## 2013-09-21 VITALS — BP 94/62 | HR 76 | Temp 98.2°F | Resp 16 | Wt 153.0 lb

## 2013-09-21 DIAGNOSIS — N390 Urinary tract infection, site not specified: Secondary | ICD-10-CM

## 2013-09-21 DIAGNOSIS — R300 Dysuria: Secondary | ICD-10-CM

## 2013-09-21 DIAGNOSIS — B373 Candidiasis of vulva and vagina: Secondary | ICD-10-CM

## 2013-09-21 LAB — POCT URINALYSIS DIPSTICK
Blood, UA: 50
Glucose, UA: NEGATIVE
Nitrite, UA: NEGATIVE
Protein, UA: NEGATIVE
Urobilinogen, UA: NEGATIVE

## 2013-09-21 MED ORDER — PHENAZOPYRIDINE HCL 200 MG PO TABS: 200.0000 mg | ORAL_TABLET | Freq: Three times a day (TID) | ORAL | Status: DC | PRN

## 2013-09-21 MED ORDER — NYSTATIN-TRIAMCINOLONE 100000-0.1 UNIT/GM-% EX OINT: 1.0000 "application " | TOPICAL_OINTMENT | Freq: Two times a day (BID) | CUTANEOUS | Status: DC

## 2013-09-21 NOTE — Telephone Encounter (Signed)
Patient with continued back pain/dysuria/frequency and completed abx.  No fevers or chills. Office visit scheduled for today.

## 2013-09-21 NOTE — Progress Notes (Signed)
57 y.o. Married Caucasian female G0P0000 here for follow up of UTI treated with Macrobid initiated on October 30/14. Completed all medication as directed.  Denies any symptoms of urinary urgency or pain. Complaining of irritation in vulva area when urine touches skin. Still feel like she has some low back pain on right, but no intermittent pain. Denies muscle strain or problems.Denies abdominal pain, fever or chills. Took Diflucan due to history or yeast infection. "Feels OK" just not sure resolved.   O: Healthy WD,WN female Affect: normal  Skin:warm and dry Abdomen:suprapubic negative, abdomen soft no rebound CVAT negative bilateral, patient points to lower right hip area as area of discomfort when occurs. Pelvic exam:EXTERNAL GENITALIA: vulva red, slightly edema, and tenderness, BUS negative, bladder,urethral meatus non tender Wet prep taken VAGINA: no abnormal discharge or lesions and moist wet prep taken ph 4.0 CERVIX: absent UTERUS: absent ADNEXA: no masses palpable, nontender and adnexa not present RECTUM: no lesions  POCT Urine: blood 50  Wet Prep: negative yeast vaginally, positive yeast on vulva  A:UTI questionable Resolved  History of microhematuria with negative urology work up Yeast vulvitis   P: Discussed findings of questionable resolving UTI and will do repeat culture to see if cleared. Exam and urine do not indicate UTI. Discussed Pyridium use for frequency to see if resolves discomfort. Patient agreeable. Rx Pyridium see order Reviewed yeast findings: Rx Mycolog see order Lab: Urine micro/culture  Rv prn

## 2013-09-21 NOTE — Telephone Encounter (Signed)
Patient was in on the 30th for a uti and finished medication yesterday but doesn't feel like the uti is gone.

## 2013-09-22 LAB — URINALYSIS, MICROSCOPIC ONLY
Bacteria, UA: NONE SEEN
Crystals: NONE SEEN

## 2013-09-22 LAB — URINE CULTURE

## 2013-09-23 NOTE — Progress Notes (Signed)
Note reviewed, agree with plan.  Rosalinda Seaman, MD  

## 2013-09-28 ENCOUNTER — Other Ambulatory Visit (INDEPENDENT_AMBULATORY_CARE_PROVIDER_SITE_OTHER): Payer: Managed Care, Other (non HMO)

## 2013-09-28 ENCOUNTER — Encounter: Payer: Self-pay | Admitting: Internal Medicine

## 2013-09-28 ENCOUNTER — Ambulatory Visit (INDEPENDENT_AMBULATORY_CARE_PROVIDER_SITE_OTHER): Payer: Managed Care, Other (non HMO) | Admitting: Internal Medicine

## 2013-09-28 VITALS — BP 108/78 | HR 103 | Temp 97.7°F | Ht 67.0 in | Wt 149.2 lb

## 2013-09-28 DIAGNOSIS — N760 Acute vaginitis: Secondary | ICD-10-CM

## 2013-09-28 DIAGNOSIS — M545 Low back pain, unspecified: Secondary | ICD-10-CM

## 2013-09-28 DIAGNOSIS — R3915 Urgency of urination: Secondary | ICD-10-CM

## 2013-09-28 DIAGNOSIS — Z0000 Encounter for general adult medical examination without abnormal findings: Secondary | ICD-10-CM

## 2013-09-28 LAB — HEPATIC FUNCTION PANEL
ALT: 19 U/L (ref 0–35)
AST: 28 U/L (ref 0–37)
Albumin: 4.1 g/dL (ref 3.5–5.2)
Bilirubin, Direct: 0.1 mg/dL (ref 0.0–0.3)
Total Protein: 7.4 g/dL (ref 6.0–8.3)

## 2013-09-28 LAB — CBC WITH DIFFERENTIAL/PLATELET
Basophils Absolute: 0 10*3/uL (ref 0.0–0.1)
Basophils Relative: 0.4 % (ref 0.0–3.0)
Eosinophils Relative: 1.3 % (ref 0.0–5.0)
Lymphocytes Relative: 18.9 % (ref 12.0–46.0)
MCHC: 34 g/dL (ref 30.0–36.0)
MCV: 92.2 fl (ref 78.0–100.0)
Monocytes Absolute: 0.5 10*3/uL (ref 0.1–1.0)
Monocytes Relative: 8.7 % (ref 3.0–12.0)
Neutrophils Relative %: 70.7 % (ref 43.0–77.0)
Platelets: 220 10*3/uL (ref 150.0–400.0)
RDW: 13.3 % (ref 11.5–14.6)
WBC: 5.4 10*3/uL (ref 4.5–10.5)

## 2013-09-28 LAB — BASIC METABOLIC PANEL
BUN: 11 mg/dL (ref 6–23)
CO2: 29 mEq/L (ref 19–32)
Chloride: 103 mEq/L (ref 96–112)
Glucose, Bld: 83 mg/dL (ref 70–99)
Potassium: 4.2 mEq/L (ref 3.5–5.1)
Sodium: 137 mEq/L (ref 135–145)

## 2013-09-28 LAB — URINALYSIS, ROUTINE W REFLEX MICROSCOPIC
Bilirubin Urine: NEGATIVE
Leukocytes, UA: NEGATIVE
Nitrite: NEGATIVE
Specific Gravity, Urine: 1.01 (ref 1.000–1.030)
Total Protein, Urine: NEGATIVE
Urobilinogen, UA: 0.2 (ref 0.0–1.0)
pH: 6.5 (ref 5.0–8.0)

## 2013-09-28 LAB — LIPID PANEL
Cholesterol: 192 mg/dL (ref 0–200)
VLDL: 10.4 mg/dL (ref 0.0–40.0)

## 2013-09-28 LAB — TSH: TSH: 2.66 u[IU]/mL (ref 0.35–5.50)

## 2013-09-28 NOTE — Patient Instructions (Signed)
Please continue all other medications as before, and refills have been done if requested. Please have the pharmacy call with any other refills you may need. Please continue your efforts at being more active, low cholesterol diet, and weight control. You are otherwise up to date with prevention measures today. Please consider follow up with GYN, and consider referral to urology as well OK to take alleve as needed for the lower back pain Please go to the LAB in the Basement (turn left off the elevator) for the tests to be done today You will be contacted by phone if any changes need to be made immediately.  Otherwise, you will receive a letter about your results with an explanation, but please check with MyChart first.  Please remember to sign up for My Chart if you have not done so, as this will be important to you in the future with finding out test results, communicating by private email, and scheduling acute appointments online when needed.  Please return in 1 year for your yearly visit, or sooner if needed, with Lab testing done 3-5 days before

## 2013-09-28 NOTE — Progress Notes (Signed)
Subjective:    Patient ID: [name], female    DOB: 06-Oct-1956, 57 y.o.   MRN: [id number]  HPI  Here for wellness and f/u;  Overall doing ok;  Pt denies CP, worsening SOB, DOE, wheezing, orthopnea, PND, worsening LE edema, palpitations, dizziness or syncope.  Pt denies neurological change such as new headache, facial or extremity weakness.  Pt denies polydipsia, polyuria, or low sugar symptoms. Pt states overall good compliance with treatment and medications, good tolerability, and has been trying to follow lower cholesterol diet.  Pt denies worsening depressive symptoms, suicidal ideation or panic. No fever, night sweats, wt loss, loss of appetite, or other constitutional symptoms.  Pt states good ability with ADL's, has low fall risk, home safety reviewed and adequate, no other significant changes in hearing or vision, and only occasionally active with exercise.  Pt continues to have recurring right LBP without change in severity, bowel or bladder change, fever, wt loss,  worsening LE pain/numbness/weakness, gait change or falls. Also with recent root canal with antibx, then UTI for antibx, then fungal vaginitis tx, now with tender/irritation to vulva persists.  Also with some urinary freq but Denies urinary symptoms such as dysuria, urgency, flank pain, hematuria or n/v, fever, chills. Past Medical History  Diagnosis Date  . GERD (gastroesophageal reflux disease)   . Fibroids   . CIN I (cervical intraepithelial neoplasia I) 2005  . Condyloma   . ASCUS with positive high risk HPV 09/2004   Past Surgical History  Procedure Laterality Date  . Myomectomy  1991  . Cervical biopsy  w/ loop electrode excision      CIN 3-Clear margins   . Total abdominal hysterectomy  08/27/2008    Aborted R TLH-TAH ov retained; fibroids recurrent CIN, path-fibroids, CIN I    reports that she has never smoked. She has never used smokeless tobacco. She reports that she drinks about 0.5 ounces of alcohol per  week. She reports that she does not use illicit drugs. family history includes Congestive Heart Failure in her father; Hypertension in her mother. No Known Allergies Current Outpatient Prescriptions on File Prior to Visit  Medication Sig Dispense Refill  . estradiol (CLIMARA - DOSED IN MG/24 HR) 0.0375 mg/24hr Place 1 patch onto the skin once a week.      . famotidine (PEPCID) 20 MG tablet Take 1 tablet (20 mg total) by mouth 2 (two) times daily.  10 tablet  0  . nystatin-triamcinolone ointment (MYCOLOG) Apply 1 application topically 2 (two) times daily. For 5 days  30 g  0   No current facility-administered medications on file prior to visit.    Review of Systems Constitutional: Negative for diaphoresis, activity change, appetite change or unexpected weight change.  HENT: Negative for hearing loss, ear pain, facial swelling, mouth sores and neck stiffness.   Eyes: Negative for pain, redness and visual disturbance.  Respiratory: Negative for shortness of breath and wheezing.   Cardiovascular: Negative for chest pain and palpitations.  Gastrointestinal: Negative for diarrhea, blood in stool, abdominal distention or other pain Genitourinary: Negative for hematuria, flank pain or change in urine volume.  Musculoskeletal: Negative for myalgias and joint swelling.  Skin: Negative for color change and wound.  Neurological: Negative for syncope and numbness. other than noted Hematological: Negative for adenopathy.  Psychiatric/Behavioral: Negative for hallucinations, self-injury, decreased concentration and agitation.      Objective:   Physical Exam BP 108/78  Pulse 103  Temp(Src) 97.7 F (36.5 C) (Oral)  Ht 5\' 7"  (1.702 m)  Wt 149 lb 4 oz (67.699 kg)  BMI 23.37 kg/m2  SpO2 98% VS noted,  Constitutional: Pt is oriented to person, place, and time. Appears well-developed and well-nourished.  Head: Normocephalic and atraumatic.  Right Ear: External ear normal.  Left Ear: External ear  normal.  Nose: Nose normal.  Mouth/Throat: Oropharynx is clear and moist.  Eyes: Conjunctivae and EOM are normal. Pupils are equal, round, and reactive to light.  Neck: Normal range of motion. Neck supple. No JVD present. No tracheal deviation present.  Cardiovascular: Normal rate, regular rhythm, normal heart sounds and intact distal pulses.   Pulmonary/Chest: Effort normal and breath sounds normal.  Abdominal: Soft. Bowel sounds are normal. There is no tenderness. No HSM  Musculoskeletal: Normal range of motion. Exhibits no edema.  Lymphadenopathy:  Has no cervical adenopathy.  Neurological: Pt is alert and oriented to person, place, and time. Pt has normal reflexes. No cranial nerve deficit.  Skin: Skin is warm and dry. No rash noted.  Psychiatric:  Has  normal mood and affect. Behavior is normal.  Spine nontender, does have right lumbar paravertebral tender/soreness Pelvic - deferred    Assessment & Plan:

## 2013-09-28 NOTE — Assessment & Plan Note (Signed)
Recent cx neg, urged f/u with urology, declines for now

## 2013-09-28 NOTE — Assessment & Plan Note (Signed)
C/w prob underlying lumbar DJD or DDD, offered films to assess but delcine, neuro no change, for alleve prn

## 2013-09-28 NOTE — Assessment & Plan Note (Signed)

## 2013-09-28 NOTE — Assessment & Plan Note (Signed)
Hx sounds like vulvitis not better with recent antibx, antifungal, would need to consider steroid tx I think, urged return to GYN

## 2013-09-28 NOTE — Progress Notes (Signed)
Pre-visit discussion using our clinic review tool. No additional management support is needed unless otherwise documented below in the visit note.  

## 2013-10-03 ENCOUNTER — Ambulatory Visit: Payer: Managed Care, Other (non HMO)

## 2013-10-03 NOTE — Telephone Encounter (Signed)
Pt DNKA FOR LAB TODAY. CALL TO PT LMTCB TO RESCHEDULE

## 2013-10-31 ENCOUNTER — Encounter: Payer: Managed Care, Other (non HMO) | Admitting: Internal Medicine

## 2013-11-14 ENCOUNTER — Other Ambulatory Visit: Payer: Self-pay

## 2013-11-14 DIAGNOSIS — Z1231 Encounter for screening mammogram for malignant neoplasm of breast: Secondary | ICD-10-CM

## 2013-12-05 ENCOUNTER — Telehealth: Payer: Self-pay | Admitting: *Deleted

## 2013-12-05 ENCOUNTER — Encounter: Payer: Self-pay | Admitting: Internal Medicine

## 2013-12-05 ENCOUNTER — Ambulatory Visit (INDEPENDENT_AMBULATORY_CARE_PROVIDER_SITE_OTHER)
Admission: RE | Admit: 2013-12-05 | Discharge: 2013-12-05 | Disposition: A | Discharge status: Home / Self Care | Payer: Managed Care, Other (non HMO) | Source: Ambulatory Visit | Attending: Internal Medicine | Admitting: Internal Medicine

## 2013-12-05 DIAGNOSIS — M545 Low back pain, unspecified: Secondary | ICD-10-CM

## 2013-12-05 NOTE — Telephone Encounter (Signed)
Sasser for films, has been ordered

## 2013-12-05 NOTE — Telephone Encounter (Signed)
Patient informed. 

## 2013-12-05 NOTE — Telephone Encounter (Signed)
Patient called and stated that she was seen back in November for a physical and made provider aware of her back pain that she was experiencing. Patient states that she was offered to have an X-ray done at that time but she refused because she wanted to see if it gets better. Patient states that she is still having back pain and would like to go ahead and schedule for her to have an Xray done. Please advise.

## 2013-12-11 ENCOUNTER — Ambulatory Visit
Admission: RE | Admit: 2013-12-11 | Discharge: 2013-12-11 | Disposition: A | Discharge status: Home / Self Care | Payer: Self-pay | Source: Ambulatory Visit

## 2013-12-11 ENCOUNTER — Ambulatory Visit: Payer: Managed Care, Other (non HMO)

## 2013-12-11 DIAGNOSIS — Z1231 Encounter for screening mammogram for malignant neoplasm of breast: Secondary | ICD-10-CM

## 2014-01-09 ENCOUNTER — Encounter: Payer: Managed Care, Other (non HMO) | Admitting: Internal Medicine

## 2014-01-17 ENCOUNTER — Telehealth: Payer: Self-pay | Admitting: Obstetrics and Gynecology

## 2014-01-17 MED ORDER — ESTRADIOL 0.05 MG/24HR TD PTTW
1.0000 | MEDICATED_PATCH | TRANSDERMAL | Status: DC
Start: 2014-01-17 — End: 2014-01-30

## 2014-01-17 NOTE — Telephone Encounter (Signed)
Patient states she needs a refill of minivelle patch  [facility]  [phone]

## 2014-01-17 NOTE — Telephone Encounter (Signed)
Last AEX 01/24/13  Last refill 01/24/13 x 1 year Next appt 01/30/14  Will refill until appt.  -Pt notified that rx was sent to pharmacy.

## 2014-01-30 ENCOUNTER — Ambulatory Visit (INDEPENDENT_AMBULATORY_CARE_PROVIDER_SITE_OTHER): Payer: Managed Care, Other (non HMO) | Admitting: Obstetrics and Gynecology

## 2014-01-30 ENCOUNTER — Encounter: Payer: Self-pay | Admitting: Obstetrics and Gynecology

## 2014-01-30 VITALS — BP 100/64 | HR 64 | Ht 67.0 in | Wt 145.0 lb

## 2014-01-30 DIAGNOSIS — Z01419 Encounter for gynecological examination (general) (routine) without abnormal findings: Secondary | ICD-10-CM

## 2014-01-30 DIAGNOSIS — Z0000 Encounter for general adult medical examination without abnormal findings: Secondary | ICD-10-CM

## 2014-01-30 LAB — POCT URINALYSIS DIPSTICK
Bilirubin, UA: NEGATIVE
Glucose, UA: NEGATIVE
Ketones, UA: NEGATIVE
Leukocytes, UA: NEGATIVE
Nitrite, UA: NEGATIVE
PH UA: 6
Protein, UA: NEGATIVE
Urobilinogen, UA: NEGATIVE

## 2014-01-30 MED ORDER — ESTRADIOL 0.05 MG/24HR TD PTTW: 1.0000 | MEDICATED_PATCH | TRANSDERMAL | Status: DC

## 2014-01-30 NOTE — Patient Instructions (Signed)

## 2014-01-30 NOTE — Progress Notes (Signed)
Patient ID: [name], female   DOB: 12-Jun-1956, 58 y.o.   MRN: [id number] GYNECOLOGY VISIT  PCP:   Cathlean Cower, MD  Referring provider:  none  HPI: 58 y.o.   Married  Caucasian  female   G0P0000 with No LMP recorded. Patient has had a hysterectomy.   Patient is on [place]. pathc treats hot flashes and dryness.   Maternal and paternal aunts and paternal GM with breast cancer.  Hgb:  PCP Urine:  Trace RBCs.  History of microscopic hematuria. Saw urology and had cystoscopy which was normal. Asymptomatic.    GYNECOLOGIC HISTORY: No LMP recorded. Patient has had a hysterectomy. for fibroids and CIN I.   Sexually active:  yes Partner preference:  female Contraception:   hysterectomy Menopausal hormone therapy: Minivelle  DES exposure:   none Blood transfusions:   none Sexually transmitted diseases:  none    GYN procedures and prior surgeries: colpo/LEEP, myomectomy, hysterectomy   Last mammogram:  12/11/13, 3D, negative               Last pap and high risk HPV testing:   01/24/13, WNL History of abnormal pap smear:  Yes.  History of CIN III in 2009.    OB History   Grav Para Term Preterm Abortions TAB SAB Ect Mult Living   0 0 0 0 0 0 0 0 0 0        LIFESTYLE: Exercise:   Water aerobics 3 times per week        Tobacco:  none Alcohol:  seldom Drug use:  none  OTHER HEALTH MAINTENANCE: Tetanus/TDap:  ?  Declines today. Gardisil: n/a Influenza:  2014 Zostavax:  none  Bone density:  none Colonoscopy:  2010, repeat in 10 years  Cholesterol check: 09/2013 - normal.   Family History  Problem Relation Age of Onset  . Congestive Heart Failure Father   . Hypertension Mother     Patient Active Problem List   Diagnosis Date Noted  . Lower back pain 09/28/2013  . Preventative health care 09/28/2013  . Vaginitis and vulvovaginitis 09/28/2013  . Urinary urgency 09/28/2013  . INCISIONAL HERNIA 08/28/2010  . GERD 08/05/2007   Past Medical History  Diagnosis Date  .  GERD (gastroesophageal reflux disease)   . Fibroids   . CIN I (cervical intraepithelial neoplasia I) 2005  . Condyloma   . ASCUS with positive high risk HPV 09/2004    Past Surgical History  Procedure Laterality Date  . Myomectomy  1991  . Cervical biopsy  w/ loop electrode excision      CIN 3-Clear margins   . Total abdominal hysterectomy  08/27/2008    Aborted R TLH-TAH ov retained; fibroids recurrent CIN, path-fibroids, CIN I    ALLERGIES: Review of patient's allergies indicates no known allergies.  Current Outpatient Prescriptions  Medication Sig Dispense Refill  . estradiol (VIVELLE-DOT) 0.05 MG/24HR patch Place 1 patch (0.05 mg total) onto the skin 2 (two) times a week.  4 patch  0  . famotidine (PEPCID) 20 MG tablet Take 1 tablet (20 mg total) by mouth 2 (two) times daily.  10 tablet  0  . Multiple Vitamin (MULTIVITAMIN) tablet Take 1 tablet by mouth daily.      Marland Kitchen nystatin-triamcinolone ointment (MYCOLOG) Apply 1 application topically 2 (two) times daily. For 5 days  30 g  0  . Omega-3 Fatty Acids (FISH OIL PO) Take 1 capsule by mouth daily.       No current facility-administered  medications for this visit.     ROS:  Pertinent items are noted in HPI.  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  Married.  2 step children and 1 grandchild on the way.    PHYSICAL EXAMINATION:    BP 100/64  Pulse 64  Ht 5\' 7"  (1.702 m)  Wt 145 lb (65.772 kg)  BMI 22.71 kg/m2   Wt Readings from Last 3 Encounters:  01/30/14 145 lb (65.772 kg)  09/28/13 149 lb 4 oz (67.699 kg)  09/21/13 153 lb (69.4 kg)     Ht Readings from Last 3 Encounters:  01/30/14 5\' 7"  (1.702 m)  09/28/13 5\' 7"  (1.702 m)  11/16/12 5\' 7"  (1.702 m)    General appearance: alert, cooperative and appears stated age Head: Normocephalic, without obvious abnormality, atraumatic Neck: no adenopathy, supple, symmetrical, trachea midline and thyroid not enlarged, symmetric, no  tenderness/mass/nodules Lungs: clear to auscultation bilaterally Breasts: Inspection negative, No nipple retraction or dimpling, No nipple discharge or bleeding, No axillary or supraclavicular adenopathy, Normal to palpation without dominant masses Heart: regular rate and rhythm Abdomen: soft, non-tender; no masses,  no organomegaly Extremities: extremities normal, atraumatic, no cyanosis or edema Skin: Skin color, texture, turgor normal. No rashes or lesions Lymph nodes: Cervical, supraclavicular, and axillary nodes normal. No abnormal inguinal nodes palpated Neurologic: Grossly normal  Pelvic: External genitalia:  no lesions              Urethra:  normal appearing urethra with no masses, tenderness or lesions              Bartholins and Skenes: normal                 Vagina: normal appearing vagina with normal color and discharge, no lesions              Cervix: absent              Pap and high risk HPV testing done: yes.            Bimanual Exam:  Uterus:   absent                                      Adnexa: normal adnexa in size, nontender and no masses                                      Rectovaginal: Confirms                                      Anus:  normal sphincter tone, no lesions  ASSESSMENT  Normal gynecologic exam. Status post TAH. For fibroids and recurrent CIN. Ovaries retained. ERT patient   PLAN  Mammogram recommended yearly.  Pap smear and high risk HPV testing yearly.  Counseled on self breast exam, Calcium and vitamin D intake, exercise. Will continue with Minivelle 0.05 mg twice weekly.  See EPIC orders.  Risks of DVT, PE, MI, stroke, and breast cancer discussed.  Also discussed benefits.  Return annually or prn   An After Visit Summary was printed and given to the patient.

## 2014-01-31 ENCOUNTER — Ambulatory Visit: Payer: Managed Care, Other (non HMO) | Admitting: Certified Nurse Midwife

## 2014-02-01 LAB — IPS PAP TEST WITH HPV

## 2014-02-05 ENCOUNTER — Telehealth: Payer: Self-pay | Admitting: Obstetrics and Gynecology

## 2014-02-05 DIAGNOSIS — R87612 Low grade squamous intraepithelial lesion on cytologic smear of cervix (LGSIL): Secondary | ICD-10-CM

## 2014-02-05 NOTE — Telephone Encounter (Signed)
Message copied by Jasmine Awe on Tue Feb 05, 2014  9:44 AM ------      Message from: [place], BROOK E      Created: Mon Feb 04, 2014 11:30 AM       Olivia Mackie,            Please inform patient of her LGSIL pap and negative HR HPV status.  Yeast was also noted.  I do recommend a colposcopy as the patient has a history of prior CIN III.  For the yeast, she can do over the counter Gynelotrimin or Monistat 3, if she is symptomatic.             Danella Maiers. ------

## 2014-02-05 NOTE — Telephone Encounter (Signed)
Treatment for yeast not needed prior to colposcopy.  This looks like it is an incidental finding at the time of her pap smear.

## 2014-02-05 NOTE — Telephone Encounter (Signed)
Spoke with patient. Advised that per benefits quote received, she has a $3000 cal year deductible that has not been met. She will be responsible for paying $344.21 at the time of service 03.26.2015. Patient agreeable.

## 2014-02-05 NOTE — Telephone Encounter (Signed)
Dr. Quincy Simmonds, we did not discuss treatment with otc treatments for yeast prior to colposcopy since she is scheduled for 02/07/14. Will this be okay or does she need to treat first if symptomatic prior to colposcopy.

## 2014-02-05 NOTE — Telephone Encounter (Signed)
Patient called back. She states that her health insurance plan has a HSA (health savings account) with a balance of about $1000 remaining. She is requesting that she NOT pay the $344.21 due at the time of service 03.26 and that the claim be filed and have the [place] account reimburse the office for the services that way. I advised that this is not normal office protocol, but that I would discuss with billing and have our billing representative give her a call back.

## 2014-02-05 NOTE — Telephone Encounter (Signed)
Patient is asking to talk with [place]. Re; earlier conversation this morning.

## 2014-02-05 NOTE — Telephone Encounter (Signed)
Spoke with patient. Message from [place] given. Patient agreeable to schedule colpo at this time. Colpo scheduled for 3/26 at 1500 with Dr. Quincy Simmonds. Colposcopy pre-procedure instructions given. Motrin instructions given. Motrin=Advil=Ibuprofen Can take 800 mg (Can purchase over the counter, you will need four 200 mg pills) every 8 hours as needed.  Take with food. Make sure to eat a meal before appointment and drink plenty of fluids. Patient verbalized understanding and will call to reschedule if will be on menses or has any concerns regarding pregnancy.

## 2014-02-05 NOTE — Telephone Encounter (Signed)
I contacted [employer]. We just need to file the claim to them and since she has a commercially funded plan, they will issue the payment. I will  Indicate on the DAR not to collect from the patient.

## 2014-02-07 ENCOUNTER — Ambulatory Visit (INDEPENDENT_AMBULATORY_CARE_PROVIDER_SITE_OTHER): Payer: Managed Care, Other (non HMO) | Admitting: Obstetrics and Gynecology

## 2014-02-07 VITALS — BP 108/60 | HR 74 | Resp 16 | Ht 67.0 in | Wt 147.0 lb

## 2014-02-07 DIAGNOSIS — R87612 Low grade squamous intraepithelial lesion on cytologic smear of cervix (LGSIL): Secondary | ICD-10-CM

## 2014-02-07 NOTE — Patient Instructions (Signed)
Please return next year for your routine visit including a pap smear.

## 2014-02-07 NOTE — Progress Notes (Signed)
Subjective:     Patient ID: [name], female   DOB: [birth year], 58 y.o.   MRN: [id number]  HPI  Patient is here for vaginal colposcopy for pap showing LGSIL and negative HR HPV. Patient is status post LEEP for CIN III with negative margins. Status post total abdominal hysterectomy for fibroids and CIN I noted on cervix.  Uses Minivelle patch.   Review of Systems     Objective:   Physical Exam  Genitourinary:     Colposcopy of vagina.  Consent performed. Speculum placed in vagina. Acetic acid placed in vagina. Cervix absent. No acetowhite lesions noted of the vaginal mucosal surfaces.  No biopsy performed.     Assessment:     LGSIL pap and normal colposcopy. Negative HR HPV. History of CIN III.     Plan:     Plan for pap in one year.

## 2014-10-01 ENCOUNTER — Encounter: Payer: Managed Care, Other (non HMO) | Admitting: Internal Medicine

## 2014-11-29 ENCOUNTER — Other Ambulatory Visit: Payer: Self-pay

## 2014-11-29 DIAGNOSIS — Z1231 Encounter for screening mammogram for malignant neoplasm of breast: Secondary | ICD-10-CM

## 2014-12-26 ENCOUNTER — Encounter (INDEPENDENT_AMBULATORY_CARE_PROVIDER_SITE_OTHER): Payer: Self-pay

## 2014-12-26 ENCOUNTER — Ambulatory Visit
Admission: RE | Admit: 2014-12-26 | Discharge: 2014-12-26 | Disposition: A | Discharge status: Home / Self Care | Payer: Managed Care, Other (non HMO) | Source: Ambulatory Visit

## 2014-12-26 DIAGNOSIS — Z1231 Encounter for screening mammogram for malignant neoplasm of breast: Secondary | ICD-10-CM

## 2015-01-11 ENCOUNTER — Other Ambulatory Visit: Payer: Self-pay | Admitting: Obstetrics and Gynecology

## 2015-01-13 NOTE — Telephone Encounter (Signed)
Medication refill request: Estradiol 0.05 mg  Last AEX:  01/30/14 with BS Next AEX: 02/12/15 with Dr. Quincy Simmonds  Last MMG (if hormonal medication request): 12/26/14 Breast Density Category C; Bi-Rads 1: Negative  Refill authorized: #8 patches/0 rfs,please advise

## 2015-02-08 ENCOUNTER — Other Ambulatory Visit: Payer: Self-pay | Admitting: Obstetrics and Gynecology

## 2015-02-10 NOTE — Telephone Encounter (Signed)
Medication refill request: Estradiol 0.05 mg  Last AEX:  01/30/14 with BS #8 patches/0 rfs was sent to pharmacy. Next AEX:  02/12/15 with BS  Last MMG (if hormonal medication request): 12/26/14 Bi-Rads 1: Negative  Refill authorized: Please advise

## 2015-02-12 ENCOUNTER — Ambulatory Visit (INDEPENDENT_AMBULATORY_CARE_PROVIDER_SITE_OTHER): Payer: Managed Care, Other (non HMO) | Admitting: Obstetrics and Gynecology

## 2015-02-12 ENCOUNTER — Encounter: Payer: Self-pay | Admitting: Obstetrics and Gynecology

## 2015-02-12 VITALS — BP 118/78 | HR 66 | Resp 14 | Ht 66.75 in | Wt 149.2 lb

## 2015-02-12 DIAGNOSIS — R319 Hematuria, unspecified: Secondary | ICD-10-CM | POA: Diagnosis not present

## 2015-02-12 DIAGNOSIS — Z0000 Encounter for general adult medical examination without abnormal findings: Secondary | ICD-10-CM | POA: Diagnosis not present

## 2015-02-12 DIAGNOSIS — Z01419 Encounter for gynecological examination (general) (routine) without abnormal findings: Secondary | ICD-10-CM

## 2015-02-12 LAB — CBC
HEMATOCRIT: 42.8 % (ref 36.0–46.0)
Hemoglobin: 14.4 g/dL (ref 12.0–15.0)
MCH: 31.3 pg (ref 26.0–34.0)
MCHC: 33.6 g/dL (ref 30.0–36.0)
MCV: 93 fL (ref 78.0–100.0)
MPV: 11.8 fL (ref 8.6–12.4)
PLATELETS: 229 10*3/uL (ref 150–400)
RBC: 4.6 MIL/uL (ref 3.87–5.11)
RDW: 13.4 % (ref 11.5–15.5)
WBC: 5.6 10*3/uL (ref 4.0–10.5)

## 2015-02-12 LAB — HEMOGLOBIN, FINGERSTICK: Hemoglobin, fingerstick: 14 g/dL (ref 12.0–16.0)

## 2015-02-12 LAB — POCT URINALYSIS DIPSTICK
Bilirubin, UA: NEGATIVE
GLUCOSE UA: NEGATIVE
KETONES UA: NEGATIVE
LEUKOCYTES UA: NEGATIVE
Nitrite, UA: NEGATIVE
PROTEIN UA: NEGATIVE
UROBILINOGEN UA: NEGATIVE
pH, UA: 5

## 2015-02-12 MED ORDER — ESTRADIOL 0.05 MG/24HR TD PTTW: MEDICATED_PATCH | TRANSDERMAL | Status: DC

## 2015-02-12 NOTE — Progress Notes (Signed)
Patient ID: [name], female   DOB: 18-Jun-1956, 59 y.o.   MRN: [id number] 58 y.o. G0P0000 MarriedCaucasianF here for annual exam.   PCP:  Cathlean Cower, MD  On Vivelle Dot 0.05 mg.  Helps with vaginal dryness.  Wants to continue.   Microscopic hematuria.  Saw urology about 15 years ago.  Had a negative work up.   Some urinary leakage if bladder is full.  Once every couple of months. Key in lock syndrome.  Does not interrupt her sleep.  No leak if coughs.   Has LGSIL pap from 2015.   Mother turned 66 last year.   No LMP recorded. Patient has had a hysterectomy.          Sexually active: Yes.  female partner  The current method of family planning is status post hysterectomy.    Exercising: No.  water aerobics. Smoker:  no  Health Maintenance: Pap:  01-30-14 LGSIL:neg HR HPV History of abnormal Pap:  Yes, pap 01-30-14 LGSIL;neg HR HPV;colposcopy no acetowhite lesions and no biopsies. History of LEEP in 2009 for CIN III--margins clear.  MMG:  12-26-14 heterogeneously dense/nl:[facility] Colonoscopy:  01/2008 normal with Dr. Collene Mares.  Next due 01/2018. BMD:   n/a TDaP:  Up to date with PCP Screening Labs:  Hb today: 14.0, Urine today: 1+RBC's--hx hematuria-has seen Urologist and had cystoscopy with normal w/u (Pt. States this was 15 years ago)   reports that she has never smoked. She has never used smokeless tobacco. She reports that she drinks about 0.6 oz of alcohol per week. She reports that she does not use illicit drugs.  Past Medical History  Diagnosis Date  . GERD (gastroesophageal reflux disease)   . Fibroids   . CIN I (cervical intraepithelial neoplasia I) 2005  . Condyloma   . ASCUS with positive high risk HPV 09/2004    Past Surgical History  Procedure Laterality Date  . Myomectomy  1991  . Cervical biopsy  w/ loop electrode excision      CIN 3-Clear margins   . Total abdominal hysterectomy  08/27/2008    Aborted R TLH-TAH ov retained; fibroids recurrent  CIN, path-fibroids, CIN I    Current Outpatient Prescriptions  Medication Sig Dispense Refill  . estradiol (VIVELLE-DOT) 0.05 MG/24HR patch PLACE 1 PATCH (0.05 MG TOTAL) ONTO THE SKIN 2 (TWO) TIMES A WEEK. 8 patch 0  . famotidine (PEPCID) 20 MG tablet Take 1 tablet (20 mg total) by mouth 2 (two) times daily. 10 tablet 0  . Multiple Vitamin (MULTIVITAMIN) tablet Take 1 tablet by mouth daily.    . Omega-3 Fatty Acids (FISH OIL PO) Take 1 capsule by mouth as needed.      No current facility-administered medications for this visit.    Family History  Problem Relation Age of Onset  . Congestive Heart Failure Father   . Hypertension Mother     ROS:  Pertinent items are noted in HPI.  Otherwise, a comprehensive ROS was negative.  Exam:   BP 118/78 mmHg  Pulse 66  Resp 14  Ht 5' 6.75" (1.695 m)  Wt 149 lb 3.2 oz (67.677 kg)  BMI 23.56 kg/m2     Height: 5' 6.75" (169.5 cm)  Ht Readings from Last 3 Encounters:  02/12/15 5' 6.75" (1.695 m)  02/07/14 5\' 7"  (1.702 m)  01/30/14 5\' 7"  (1.702 m)    General appearance: alert, cooperative and appears stated age Head: Normocephalic, without obvious abnormality, atraumatic Neck: no adenopathy, supple, symmetrical,  trachea midline and thyroid normal to inspection and palpation Lungs: clear to auscultation bilaterally Breasts: normal appearance, no masses or tenderness, Inspection negative, No nipple retraction or dimpling, No nipple discharge or bleeding, No axillary or supraclavicular adenopathy Heart: regular rate and rhythm Abdomen: soft, non-tender; bowel sounds normal; no masses,  no organomegaly Extremities: extremities normal, atraumatic, no cyanosis or edema Skin: Skin color, texture, turgor normal. No rashes or lesions Lymph nodes: Cervical, supraclavicular, and axillary nodes normal. No abnormal inguinal nodes palpated Neurologic: Grossly normal   Pelvic: External genitalia:  no lesions              Urethra:  normal appearing  urethra with no masses, tenderness or lesions              Bartholins and Skenes: normal                 Vagina: normal appearing vagina with normal color and discharge, no lesions              Cervix: absent              Pap taken: Yes.   Bimanual Exam:  Uterus:  uterus absent              Adnexa: no mass, fullness, tenderness               Rectovaginal: Confirms               Anus:  normal sphincter tone, no lesions  Chaperone was present for exam.  A:  Well Woman with normal exam Normal gynecologic exam. Status post TAH. For fibroids and recurrent CIN. Ovaries retained. Being followed for recurrent CIN I from pap in 2015.  Colpo in 2015.  No lesions so no biopsy done.  ERT patient  Microscopic hematuria.  Work up performed 15 years ago.  Patient has had persistent hematuria of varying amounts on chart review over the last 8 years.  Mild urinary urgency.   P:   Mammogram recommended yearly.  pap smear performed. Discussed benefits and risks of ERT - DVT, PE, MI, stroke, breast cancer.  She wishes to continue.  See orders for Vivelle Dot.  Routine labs done.  Urine micro and culture.  I discussed re-evaluation with urology and patient declines at this time.  return annually or prn

## 2015-02-12 NOTE — Patient Instructions (Signed)

## 2015-02-13 LAB — COMPREHENSIVE METABOLIC PANEL
ALK PHOS: 65 U/L (ref 39–117)
ALT: 9 U/L (ref 0–35)
AST: 16 U/L (ref 0–37)
Albumin: 4.2 g/dL (ref 3.5–5.2)
BILIRUBIN TOTAL: 0.7 mg/dL (ref 0.2–1.2)
BUN: 10 mg/dL (ref 6–23)
CALCIUM: 9 mg/dL (ref 8.4–10.5)
CHLORIDE: 102 meq/L (ref 96–112)
CO2: 25 meq/L (ref 19–32)
CREATININE: 0.85 mg/dL (ref 0.50–1.10)
GLUCOSE: 73 mg/dL (ref 70–99)
POTASSIUM: 4.2 meq/L (ref 3.5–5.3)
Sodium: 137 mEq/L (ref 135–145)
Total Protein: 7 g/dL (ref 6.0–8.3)

## 2015-02-13 LAB — URINE CULTURE: Colony Count: 7000

## 2015-02-13 LAB — URINALYSIS, MICROSCOPIC ONLY
BACTERIA UA: NONE SEEN
CRYSTALS: NONE SEEN
Casts: NONE SEEN
Squamous Epithelial / LPF: NONE SEEN

## 2015-02-13 LAB — LIPID PANEL
CHOL/HDL RATIO: 2.7 ratio
Cholesterol: 183 mg/dL (ref 0–200)
HDL: 67 mg/dL (ref >=46)
LDL CALC: 98 mg/dL (ref 0–99)
TRIGLYCERIDES: 89 mg/dL (ref <150)
VLDL: 18 mg/dL (ref 0–40)

## 2015-02-18 LAB — IPS PAP TEST WITH HPV

## 2015-02-20 ENCOUNTER — Telehealth: Payer: Self-pay

## 2015-02-20 NOTE — Telephone Encounter (Signed)
Left message to call Kaitlyn at [phone]. 

## 2015-02-20 NOTE — Telephone Encounter (Signed)
Spoke with patient. All results given as seen below. Patient is agreeable and verbalizes understanding. 08 recall entered.  Notes Recorded by Nunzio Cobbs, MD on 02/14/2015 at 10:19 AM Please report negative and normal urine evaluation and blood work to patient.  No UTI.  Normal cholesterol, blood counts, and blood chemistry results.

## 2015-02-20 NOTE — Telephone Encounter (Signed)
-----   Message from Nunzio Cobbs, MD sent at 02/19/2015  6:05 PM EDT ----- Please contact patient with results showing LGSIL and negative HR HPV this year.   Due to the negative HR HPV, she will need cotesting with Pap and HR HPV in one year.  Please enter 08 recall.  She had a normal colpo last year and no biopsy needed.  She had some yeast noted on the biopsy.  If she has itching or irritation, she may treat with OTC Monistat 1 or 3.

## 2015-07-30 ENCOUNTER — Telehealth: Payer: Self-pay | Admitting: Obstetrics and Gynecology

## 2015-07-30 NOTE — Telephone Encounter (Signed)
Patient calling requesting to speak with the nurse. She said, "I really just need some guidance on how to stop using my hormone patch."

## 2015-07-30 NOTE — Telephone Encounter (Signed)
Patient given message from Dr. Quincy Simmonds.  She is agreeable and will dc HRT at this time and try estroven.  She will call back with any concerns and follow up as needed.  Routing to provider for final review. Patient agreeable to disposition. Will close encounter.

## 2015-07-30 NOTE — Telephone Encounter (Signed)
I would just go ahead and stop the estrogen all together.  Try some Estroven - soy and black cohosh - for herbal treatment of menopausal symptoms.  If symptoms are difficult, we can discuss nonhormonal treatment of hot flashes.

## 2015-07-30 NOTE — Telephone Encounter (Signed)
Spoke with patient. She states she has been thinking about stopping HRT. She went on a 12 hour long car ride in August and felt some R calf pain after care ride and became concerned about effects of staying on HRT. She denies any SOB, calf pain, redness or swelling at this time. She is advised if returns to call PCP or seek care at local emergency department.   Patient unsure if she would like to taper off patches or just discontinue completely. Advised will send message to Dr. Quincy Simmonds for instructions and patient agreeable.

## 2015-11-28 ENCOUNTER — Ambulatory Visit (INDEPENDENT_AMBULATORY_CARE_PROVIDER_SITE_OTHER): Payer: Managed Care, Other (non HMO) | Admitting: Obstetrics and Gynecology

## 2015-11-28 ENCOUNTER — Encounter: Payer: Self-pay | Admitting: Obstetrics and Gynecology

## 2015-11-28 VITALS — BP 110/66 | HR 70 | Ht 66.75 in | Wt 148.8 lb

## 2015-11-28 DIAGNOSIS — N952 Postmenopausal atrophic vaginitis: Secondary | ICD-10-CM

## 2015-11-28 DIAGNOSIS — N760 Acute vaginitis: Secondary | ICD-10-CM | POA: Diagnosis not present

## 2015-11-28 MED ORDER — NYSTATIN-TRIAMCINOLONE 100000-0.1 UNIT/GM-% EX CREA: 1.0000 "application " | TOPICAL_CREAM | Freq: Two times a day (BID) | CUTANEOUS | Status: DC

## 2015-11-28 NOTE — Patient Instructions (Signed)
Atrophic Vaginitis Atrophic vaginitis is a condition in which the tissues that line the vagina become dry and thin. This condition is most common in women who have stopped having regular menstrual periods (menopause). This usually starts when a woman is 45-59 years old. Estrogen helps to keep the vagina moist. It stimulates the vagina to produce a clear fluid that lubricates the vagina for sexual intercourse. This fluid also protects the vagina from infection. Lack of estrogen can cause the lining of the vagina to get thinner and dryer. The vagina may also shrink in size. It may become less elastic. Atrophic vaginitis tends to get worse over time as a woman's estrogen level drops. CAUSES This condition is caused by the normal drop in estrogen that happens around the time of menopause. RISK FACTORS Certain conditions or situations may lower a woman's estrogen level, which increases her risk of atrophic vaginitis. These include:  Taking medicine that blocks estrogen.  Having ovaries removed surgically.  Being treated for cancer with X-ray treatment (radiation) or medicines (chemotherapy).  Exercising very hard and often.  Having an eating disorder (anorexia).  Giving birth or breastfeeding.  Being over the age of 50.  Smoking. SYMPTOMS Symptoms of this condition include:  Pain, soreness, or bleeding during sexual intercourse (dyspareunia).  Vaginal burning, irritation, or itching.  Pain or bleeding during a vaginal examination using a speculum (pelvic exam).  Loss of interest in sexual activity.  Having burning pain when passing urine.  Vaginal discharge that is brown or yellow. In some cases, there are no symptoms. DIAGNOSIS This condition is diagnosed with a medical history and physical exam. This will include a pelvic exam that checks whether the inside of your vagina appears pale, thin, or dry. Rarely, you may also have other tests, including:  A urine test.  A test that  checks the acid balance in your vaginal fluid (acid balance test). TREATMENT Treatment for this condition may depend on the severity of your symptoms. Treatment may include:  Using an over-the-counter vaginal lubricant before you have sexual intercourse.  Using a long-acting vaginal moisturizer.  Using low-dose vaginal estrogen for moderate to severe symptoms that do not respond to other treatments. Options include creams, tablets, and inserts (vaginal rings). Before using vaginal estrogen, tell your health care provider if you have a history of:  Breast cancer.  Endometrial cancer.  Blood clots.  Taking medicines. You may be able to take a daily pill for dyspareunia. Discuss all of the risks of this medicine with your health care provider. It is usually not recommended for women who have a family history or personal history of breast cancer. If your symptoms are very mild and you are not sexually active, you may not need treatment. HOME CARE INSTRUCTIONS  Take medicines only as directed by your health care provider. Do not use herbal or alternative medicines unless your health care provider says that you can.  Use over-the-counter creams, lubricants, or moisturizers for dryness only as directed by your health care provider.  If your atrophic vaginitis is caused by menopause, discuss all of your menopausal symptoms and treatment options with your health care provider.  Do not douche.  Do not use products that can make your vagina dry. These include:  Scented feminine sprays.  Scented tampons.  Scented soaps.  If it hurts to have sex, talk with your sexual partner. SEEK MEDICAL CARE IF:  Your discharge looks different than normal.  Your vagina has an unusual smell.  You have   new symptoms.  Your symptoms do not improve with treatment.  Your symptoms get worse.   This information is not intended to replace advice given to you by your health care provider. Make sure you  discuss any questions you have with your health care provider.   Document Released: 03/18/2015 Document Reviewed: 03/18/2015 Elsevier Interactive Patient Education [employer].  Vaginitis Vaginitis is an inflammation of the vagina. It is most often caused by a change in the normal balance of the bacteria and yeast that live in the vagina. This change in balance causes an overgrowth of certain bacteria or yeast, which causes the inflammation. There are different types of vaginitis, but the most common types are:  Bacterial vaginosis.  Yeast infection (candidiasis).  Trichomoniasis vaginitis. This is a sexually transmitted infection (STI).  Viral vaginitis.  Atrophic vaginitis.  Allergic vaginitis. CAUSES  The cause depends on the type of vaginitis. Vaginitis can be caused by:  Bacteria (bacterial vaginosis).  Yeast (yeast infection).  A parasite (trichomoniasis vaginitis)  A virus (viral vaginitis).  Low hormone levels (atrophic vaginitis). Low hormone levels can occur during pregnancy, breastfeeding, or after menopause.  Irritants, such as bubble baths, scented tampons, and feminine sprays (allergic vaginitis). Other factors can change the normal balance of the yeast and bacteria that live in the vagina. These include:  Antibiotic medicines.  Poor hygiene.  Diaphragms, vaginal sponges, spermicides, birth control pills, and intrauterine devices (IUD).  Sexual intercourse.  Infection.  Uncontrolled diabetes.  A weakened immune system. SYMPTOMS  Symptoms can vary depending on the cause of the vaginitis. Common symptoms include:  Abnormal vaginal discharge.  The discharge is white, gray, or yellow with bacterial vaginosis.  The discharge is thick, white, and cheesy with a yeast infection.  The discharge is frothy and yellow or greenish with trichomoniasis.  A bad vaginal odor.  The odor is fishy with bacterial vaginosis.  Vaginal itching, pain, or  swelling.  Painful intercourse.  Pain or burning when urinating. Sometimes, there are no symptoms. TREATMENT  Treatment will vary depending on the type of infection.   Bacterial vaginosis and trichomoniasis are often treated with antibiotic creams or pills.  Yeast infections are often treated with antifungal medicines, such as vaginal creams or suppositories.  Viral vaginitis has no cure, but symptoms can be treated with medicines that relieve discomfort. Your sexual partner should be treated as well.  Atrophic vaginitis may be treated with an estrogen cream, pill, suppository, or vaginal ring. If vaginal dryness occurs, lubricants and moisturizing creams may help. You may be told to avoid scented soaps, sprays, or douches.  Allergic vaginitis treatment involves quitting the use of the product that is causing the problem. Vaginal creams can be used to treat the symptoms. HOME CARE INSTRUCTIONS   Take all medicines as directed by your caregiver.  Keep your genital area clean and dry. Avoid soap and only rinse the area with water.  Avoid douching. It can remove the healthy bacteria in the vagina.  Do not use tampons or have sexual intercourse until your vaginitis has been treated. Use sanitary pads while you have vaginitis.  Wipe from front to back. This avoids the spread of bacteria from the rectum to the vagina.  Let air reach your genital area.  Wear cotton underwear to decrease moisture buildup.  Avoid wearing underwear while you sleep until your vaginitis is gone.    Avoid tight pants and underwear or nylons without a cotton panel.  Take off wet clothing (  especially bathing suits) as soon as possible.  Use mild, non-scented products. Avoid using irritants, such as:  Scented feminine sprays.  Fabric softeners.  Scented detergents.  Scented tampons.  Scented soaps or bubble baths.  Practice safe sex and use condoms. Condoms may prevent the spread of  trichomoniasis and viral vaginitis. SEEK MEDICAL CARE IF:   You have abdominal pain.  You have a fever or persistent symptoms for more than 2-3 days.  You have a fever and your symptoms suddenly get worse.   This information is not intended to replace advice given to you by your health care provider. Make sure you discuss any questions you have with your health care provider.   Document Released: 08/29/2007 Document Revised: 03/18/2015 Document Reviewed: 04/13/2012  Estradiol vaginal cream What is this medicine? ESTRADIOL (es tra DYE ole) contains the female hormone estrogen. It is used for symptoms of menopause, like vaginal dryness and irritation. This medicine may be used for other purposes; ask your health care provider or pharmacist if you have questions. What should I tell my health care provider before I take this medicine? They need to know if you have any of these conditions: -abnormal vaginal bleeding -blood vessel disease or blood clots -breast, cervical, endometrial, ovarian, liver, or uterine cancer -dementia -diabetes -gallbladder disease -heart disease or recent heart attack -high blood pressure -high cholesterol -high levels of calcium in the blood -hysterectomy -kidney disease -liver disease -migraine headaches -protein C deficiency -protein S deficiency -stroke -systemic lupus erythematosus (SLE) -tobacco smoker -an unusual or allergic reaction to estrogens, other hormones, soy, other medicines, foods, dyes, or preservatives -pregnant or trying to get pregnant -breast-feeding How should I use this medicine? This medicine is for use in the vagina only. Do not take by mouth. Follow the directions on the prescription label. Read package directions carefully before using. Use the special applicator supplied with the cream. Wash hands before and after use. Fill the applicator with the prescribed amount of cream. Lie on your back, part and bend your knees. Insert  the applicator into the vagina and push the plunger to expel the cream into the vagina. Wash the applicator with warm soapy water and rinse well. Use exactly as directed for the complete length of time prescribed. Do not stop using except on the advice of your doctor or health care professional. A patient package insert for the product will be given with each prescription and refill. Read this sheet carefully each time. The sheet may change frequently. Talk to your pediatrician regarding the use of this medicine in children. This medicine is not approved for use in children. Overdosage: If you think you have taken too much of this medicine contact a poison control center or emergency room at once. NOTE: This medicine is only for you. Do not share this medicine with others. What if I miss a dose? If you miss a dose, use it as soon as you can. If it is almost time for your next dose, use only that dose. Do not use double or extra doses. What may interact with this medicine? Do not take this medicine with any of the following medications: -aromatase inhibitors like aminoglutethimide, anastrozole, exemestane, letrozole, testolactone This medicine may also interact with the following medications: -barbiturates used for inducing sleep or treating seizures -carbamazepine -grapefruit juice -medicines for fungal infections like ketoconazole and itraconazole -raloxifene -rifabutin -rifampin -rifapentine -ritonavir -some antibiotics used to treat infections -St. John's Wort -tamoxifen -warfarin This list may not describe all  possible interactions. Give your health care provider a list of all the medicines, herbs, non-prescription drugs, or dietary supplements you use. Also tell them if you smoke, drink alcohol, or use illegal drugs. Some items may interact with your medicine. What should I watch for while using this medicine? Visit your health care professional for regular checks on your progress. You  will need a regular breast and pelvic exam. You should also discuss the need for regular mammograms with your health care professional, and follow his or her guidelines. This medicine can make your body retain fluid, making your fingers, hands, or ankles swell. Your blood pressure can go up. Contact your doctor or health care professional if you feel you are retaining fluid. If you have any reason to think you are pregnant, stop taking this medicine at once and contact your doctor or health care professional. Tobacco smoking increases the risk of getting a blood clot or having a stroke, especially if you are more than 59 years old. You are strongly advised not to smoke. If you wear contact lenses and notice visual changes, or if the lenses begin to feel uncomfortable, consult your eye care specialist. If you are going to have elective surgery, you may need to stop taking this medicine beforehand. Consult your health care professional for advice prior to scheduling the surgery. What side effects may I notice from receiving this medicine? Side effects that you should report to your doctor or health care professional as soon as possible: -allergic reactions like skin rash, itching or hives, swelling of the face, lips, or tongue -breast tissue changes or discharge -changes in vision -chest pain -confusion, trouble speaking or understanding -dark urine -general ill feeling or flu-like symptoms -light-colored stools -nausea, vomiting -pain, swelling, warmth in the leg -right upper belly pain -severe headaches -shortness of breath -sudden numbness or weakness of the face, arm or leg -trouble walking, dizziness, loss of balance or coordination -unusual vaginal bleeding -yellowing of the eyes or skin Side effects that usually do not require medical attention (report to your doctor or health care professional if they continue or are bothersome): -hair loss -increased hunger or thirst -increased  urination -symptoms of vaginal infection like itching, irritation or unusual discharge -unusually weak or tired This list may not describe all possible side effects. Call your doctor for medical advice about side effects. You may report side effects to FDA at 1-800-FDA-1088. Where should I keep my medicine? Keep out of the reach of children. Store at room temperature between 15 and 30 degrees C (59 and 86 degrees F). Protect from temperatures above 40 degrees C (104 degrees C). Do not freeze. Throw away any unused medicine after the expiration date. NOTE: This sheet is a summary. It may not cover all possible information. If you have questions about this medicine, talk to your doctor, pharmacist, or health care provider.    2016, Elsevier/Gold Standard. (2011-02-03 09:18:12)  Elsevier Interactive Patient Education [employer].

## 2015-11-28 NOTE — Progress Notes (Signed)
Patient ID: [name], female   DOB: January 21, 1956, 60 y.o.   MRN: [id number] GYNECOLOGY  VISIT   HPI: 60 y.o.   Married  Caucasian  female   [id number] with No LMP recorded. Patient has had a hysterectomy.   here for vaginal irritation and burning.  Some external irritation.  Patient does not complain of any vaginal odor or discharge.  No treatment so far.   Off estrogen patch since August.  Some hot flashes and night sweats.  Using vaginal personal lubricant.   GYNECOLOGIC HISTORY: No LMP recorded. Patient has had a hysterectomy. Contraception: Hysterectomy Menopausal hormone therapy: None.  Last mammogram: 12-26-14 3D/density cat.C/Neg/BiRads1:[facility] Last pap smear: 02-12-15 LGSIL:Neg HR HPV        OB History    Gravida Para Term Preterm AB TAB SAB Ectopic Multiple Living   0 0 0 0 0 0 0 0 0 0          Patient Active Problem List   Diagnosis Date Noted  . Lower back pain 09/28/2013  . Preventative health care 09/28/2013  . Vaginitis and vulvovaginitis 09/28/2013  . Urinary urgency 09/28/2013  . INCISIONAL HERNIA 08/28/2010  . GERD 08/05/2007    Past Medical History  Diagnosis Date  . GERD (gastroesophageal reflux disease)   . Fibroids   . CIN I (cervical intraepithelial neoplasia I) 2005  . Condyloma   . ASCUS with positive high risk HPV 09/2004    Past Surgical History  Procedure Laterality Date  . Myomectomy  1991  . Cervical biopsy  w/ loop electrode excision      CIN 3-Clear margins   . Total abdominal hysterectomy  08/27/2008    Aborted R TLH-TAH ov retained; fibroids recurrent CIN, path-fibroids, CIN I    Current Outpatient Prescriptions  Medication Sig Dispense Refill  . Multiple Vitamin (MULTIVITAMIN) tablet Take 1 tablet by mouth daily.    . Omega-3 Fatty Acids (FISH OIL PO) Take 1 capsule by mouth as needed.      No current facility-administered medications for this visit.     ALLERGIES: Review of patient's allergies indicates no known  allergies.  Family History  Problem Relation Age of Onset  . Congestive Heart Failure Father   . Hypertension Mother     Social History   Social History  . Marital Status: Married    Spouse Name: N/A  . Number of Children: 0  . Years of Education: N/A   Occupational History  . SOFTWARE SUPPORT   . SOFTWARE SUPPORT    Social History Main Topics  . Smoking status: Never Smoker   . Smokeless tobacco: Never Used  . Alcohol Use: 0.6 oz/week    1 Standard drinks or equivalent per week     Comment: rarely drinks  . Drug Use: No  . Sexual Activity:    Partners: Male    Birth Control/ Protection: Surgical     Comment: Hysterectomy   Other Topics Concern  . Not on file   Social History Narrative   No siblings with heart problems. Works in English as a second language teacher.    ROS:  Pertinent items are noted in HPI.  PHYSICAL EXAMINATION:    BP 110/66 mmHg  Pulse 70  Ht 5' 6.75" (1.695 m)  Wt 148 lb 12.8 oz (67.495 kg)  BMI 23.49 kg/m2    General appearance: alert, cooperative and appears stated age   Pelvic: External genitalia:  Erythema of the labia bilaterally.  Urethra:  normal appearing urethra with no masses, tenderness or lesions              Bartholins and Skenes: normal                 Vagina: normal appearing vagina with normal color and no discharge, no lesions              Cervix: absent.              Bimanual Exam:  Uterus:  absent.              Adnexa: no mass, fullness, tenderness            Chaperone was present for exam.  ASSESSMENT  Vulvitis.  Possible atrophic vaginitis.  PLAN  Counseled regarding various forms of vaginitis.  Affirm testing done.  Rx for Mycolog II cream.  See orders. May be a good candidate for local vaginal estrogen cream.  Discussed risks of DVT, PE, MI, stroke, and breast cancer.   Will await final Affirm testing.  Follow up for annual exam and prn.  An After Visit Summary was printed and given to the  patient.  __25____ minutes face to face time of which over 50% was spent in counseling.

## 2015-11-29 LAB — WET PREP BY MOLECULAR PROBE
CANDIDA SPECIES: NEGATIVE
GARDNERELLA VAGINALIS: NEGATIVE
Trichomonas vaginosis: NEGATIVE

## 2015-12-01 ENCOUNTER — Telehealth: Payer: Self-pay

## 2015-12-01 MED ORDER — NYSTATIN 100000 UNIT/GM EX CREA: 1.0000 "application " | TOPICAL_CREAM | Freq: Two times a day (BID) | CUTANEOUS | Status: DC

## 2015-12-01 MED ORDER — TRIAMCINOLONE ACETONIDE 0.1 % EX CREA: 1.0000 "application " | TOPICAL_CREAM | Freq: Two times a day (BID) | CUTANEOUS | Status: DC

## 2015-12-01 NOTE — Telephone Encounter (Signed)
Mycolog II too expensive.  Please send Rx through to her pharmacy and split the medications into two prescriptions.  Thanks!  [place]

## 2015-12-01 NOTE — Telephone Encounter (Signed)
Spoke with patient. Advised of notification from the pharmacy about cost for Mycolog II cream. Advised we have sent in Nystatin cream and Triamcinolone cream that she will mix together in a 1:1 ratio and apply to the area twice a day for 7 days. Advised these two prescriptions combined together make the same prescription that was given just at a lower cost. Patient is agreeable.  Cc: Stevenson Clinch  Routing to provider for final review. Patient agreeable to disposition. Will close encounter.

## 2015-12-01 NOTE — Telephone Encounter (Signed)
Medication Pharmacy fax: Nystatin 100000 Unit / Triamcinolone Acetonide 1 mg   Patient was last seen on 11/28/15 and prescribed the above Rx.  The [facility] pharmacy has faxed a Note for the Rx.  " Cost to patient is $229.99.  She stated for [place] to fax dr. About the price and she said you want [place] to split up?  Please explain."  Please advise?

## 2015-12-05 ENCOUNTER — Other Ambulatory Visit: Payer: Self-pay

## 2015-12-05 DIAGNOSIS — Z1231 Encounter for screening mammogram for malignant neoplasm of breast: Secondary | ICD-10-CM

## 2016-01-01 ENCOUNTER — Ambulatory Visit
Admission: RE | Admit: 2016-01-01 | Discharge: 2016-01-01 | Disposition: A | Discharge status: Home / Self Care | Payer: Managed Care, Other (non HMO) | Source: Ambulatory Visit

## 2016-01-01 DIAGNOSIS — Z1231 Encounter for screening mammogram for malignant neoplasm of breast: Secondary | ICD-10-CM

## 2016-02-25 ENCOUNTER — Ambulatory Visit: Payer: Managed Care, Other (non HMO) | Admitting: Obstetrics and Gynecology

## 2016-03-26 ENCOUNTER — Encounter: Payer: Self-pay | Admitting: Obstetrics and Gynecology

## 2016-03-26 ENCOUNTER — Ambulatory Visit (INDEPENDENT_AMBULATORY_CARE_PROVIDER_SITE_OTHER): Payer: Managed Care, Other (non HMO) | Admitting: Obstetrics and Gynecology

## 2016-03-26 VITALS — BP 100/62 | HR 70 | Resp 16 | Ht 66.5 in | Wt 146.8 lb

## 2016-03-26 DIAGNOSIS — Z0000 Encounter for general adult medical examination without abnormal findings: Secondary | ICD-10-CM

## 2016-03-26 DIAGNOSIS — R3129 Other microscopic hematuria: Secondary | ICD-10-CM

## 2016-03-26 DIAGNOSIS — Z113 Encounter for screening for infections with a predominantly sexual mode of transmission: Secondary | ICD-10-CM | POA: Diagnosis not present

## 2016-03-26 DIAGNOSIS — Z01419 Encounter for gynecological examination (general) (routine) without abnormal findings: Secondary | ICD-10-CM | POA: Diagnosis not present

## 2016-03-26 LAB — CBC
HCT: 42.1 % (ref 35.0–45.0)
Hemoglobin: 13.8 g/dL (ref 11.7–15.5)
MCH: 30.2 pg (ref 27.0–33.0)
MCHC: 32.8 g/dL (ref 32.0–36.0)
MCV: 92.1 fL (ref 80.0–100.0)
MPV: 10.8 fL (ref 7.5–12.5)
Platelets: 241 10*3/uL (ref 140–400)
RBC: 4.57 MIL/uL (ref 3.80–5.10)
RDW: 13.6 % (ref 11.0–15.0)
WBC: 6.7 10*3/uL (ref 3.8–10.8)

## 2016-03-26 LAB — LIPID PANEL
Cholesterol: 179 mg/dL (ref 125–200)
HDL: 78 mg/dL
LDL Cholesterol: 85 mg/dL
Total CHOL/HDL Ratio: 2.3 ratio
Triglycerides: 80 mg/dL
VLDL: 16 mg/dL

## 2016-03-26 LAB — COMPREHENSIVE METABOLIC PANEL
ALBUMIN: 4 g/dL (ref 3.6–5.1)
ALK PHOS: 80 U/L (ref 33–130)
ALT: 15 U/L (ref 6–29)
AST: 21 U/L (ref 10–35)
BUN: 12 mg/dL (ref 7–25)
CHLORIDE: 103 mmol/L (ref 98–110)
CO2: 24 mmol/L (ref 20–31)
CREATININE: 0.9 mg/dL (ref 0.50–1.05)
Calcium: 8.8 mg/dL (ref 8.6–10.4)
Glucose, Bld: 73 mg/dL (ref 65–99)
POTASSIUM: 4.2 mmol/L (ref 3.5–5.3)
Sodium: 139 mmol/L (ref 135–146)
TOTAL PROTEIN: 6.7 g/dL (ref 6.1–8.1)
Total Bilirubin: 0.7 mg/dL (ref 0.2–1.2)

## 2016-03-26 LAB — POCT URINALYSIS DIPSTICK
Bilirubin, UA: NEGATIVE
Glucose, UA: NEGATIVE
Ketones, UA: NEGATIVE
Leukocytes, UA: NEGATIVE
Nitrite, UA: NEGATIVE
Protein, UA: NEGATIVE
Urobilinogen, UA: NEGATIVE
pH, UA: 5

## 2016-03-26 LAB — TSH: TSH: 1.73 mIU/L

## 2016-03-26 NOTE — Progress Notes (Signed)
Patient ID: [name], female   DOB: 18-Nov-1955, 60 y.o.   MRN: [id number] 60 y.o. [place] Married Caucasian female here for annual exam.    Using vaginal H2O moisturizer, and this is working well.   Asking about HPV related cancers of head and neck.  Sister just diagnosed with breast cancer.  Sister had genetic testing which was negative.  3 maternal aunts with breast cancer.  1 paternal aunt and paternal grandmother with breast cancer.   PCP: Cathlean Cower, MD    No LMP recorded. Patient has had a hysterectomy.           Sexually active: Yes.   female The current method of family planning is status post hysterectomy.    Exercising: Yes.    water aerobics 3days/week. Smoker:  no  Health Maintenance: Pap:  02-12-15 LGSIL:Neg HR HPV History of abnormal Pap:  Yes,pap 02-12-15 LGSIL:Neg HR HPV. pap 01-30-14 LGSIL;neg HR HPV;colposcopy no acetowhite lesions and no biopsies. History of LEEP in 2009 for CIN III--margins clear. MMG:  01-02-16 3D/C/Neg/BiRads1:[facility] Colonoscopy: 01/2008 normal with Dr. Collene Mares. Next due 01/2018.  BMD:   n/a  Result  n/a TDaP:  PCP Gardasil:   N/A HIV: thinks she was tested prior to marriage in 2004. Hep C: Screening Labs:  Hb today: 13.9, Urine today: 1+RBCs--has had urological workup   reports that she has never smoked. She has never used smokeless tobacco. She reports that she drinks about 0.6 oz of alcohol per week. She reports that she does not use illicit drugs.  Past Medical History  Diagnosis Date  . GERD (gastroesophageal reflux disease)   . Fibroids   . CIN I (cervical intraepithelial neoplasia I) 2005  . Condyloma   . ASCUS with positive high risk HPV 09/2004    Past Surgical History  Procedure Laterality Date  . Myomectomy  1991  . Cervical biopsy  w/ loop electrode excision      CIN 3-Clear margins   . Total abdominal hysterectomy  08/27/2008    Aborted R TLH-TAH ov retained; fibroids recurrent CIN, path-fibroids, CIN I     Current Outpatient Prescriptions  Medication Sig Dispense Refill  . Multiple Vitamin (MULTIVITAMIN) tablet Take 1 tablet by mouth daily.    . Omega-3 Fatty Acids (FISH OIL PO) Take 1 capsule by mouth as needed.      No current facility-administered medications for this visit.    Family History  Problem Relation Age of Onset  . Congestive Heart Failure Father   . Hypertension Mother   . Breast cancer Sister 7    Dx'd 03/2016    ROS:  Pertinent items are noted in HPI.  Otherwise, a comprehensive ROS was negative.  Exam:   BP 100/62 mmHg  Pulse 70  Resp 16  Ht 5' 6.5" (1.689 m)  Wt 146 lb 12.8 oz (66.588 kg)  BMI 23.34 kg/m2    General appearance: alert, cooperative and appears stated age Head: Normocephalic, without obvious abnormality, atraumatic Neck: no adenopathy, supple, symmetrical, trachea midline and thyroid normal to inspection and palpation Lungs: clear to auscultation bilaterally Breasts: normal appearance, no masses or tenderness, Inspection negative, No nipple retraction or dimpling, No nipple discharge or bleeding, No axillary or supraclavicular adenopathy Heart: regular rate and rhythm Abdomen: soft, non-tender; no masses, no organomegaly Extremities: extremities normal, atraumatic, no cyanosis or edema Skin: Skin color, texture, turgor normal. No rashes or lesions Lymph nodes: Cervical, supraclavicular, and axillary nodes normal. No abnormal inguinal nodes palpated  Neurologic: Grossly normal  Pelvic: External genitalia:  no lesions              Urethra:  normal appearing urethra with no masses, tenderness or lesions              Bartholins and Skenes: normal                 Vagina: normal appearing vagina with normal color and discharge, no lesions              Cervix: absent              Pap taken: Yes.   Bimanual Exam:  Uterus:  uterus absent              Adnexa: no mass, fullness, tenderness              Rectal exam: Yes.  .  Confirms.               Anus:  normal sphincter tone, no lesions  Chaperone was present for exam.  Assessment:   Well woman visit with normal exam. Status post robotic TLH.  Ovaries retained.  LGSIL.  Hx prior CIN III.  Negative HR HPV status on last pap. Hx of prior dysplasia.  Microscopic hematuria.   FH breast cancer.   Plan: Yearly mammogram recommended after age 41.  Recommended self breast exam.  Pap and HR HPV as above. Discussed Calcium, Vitamin D, regular exercise program including cardiovascular and weight bearing exercise. Labs performed.  Yes.  .   See orders.  Routine labs, urine micro and culture. Prescription medication(s) given.  No..    We did discuss HPV related cancers of cervix, vulva, vagina, anus/rectum, and head and neck.  I recommended routine dental visits and biopsy of any white or concerning oral lesions.  I discussed cervical lymphadenopathy as a potential sign of disease as well.  Follow up annually and prn.      After visit summary provided.

## 2016-03-26 NOTE — Patient Instructions (Signed)
Health Maintenance, Female Adopting a healthy lifestyle and getting preventive care can go a long way to promote health and wellness. Talk with your health care provider about what schedule of regular examinations is right for you. This is a good chance for you to check in with your provider about disease prevention and staying healthy. In between checkups, there are plenty of things you can do on your own. Experts have done a lot of research about which lifestyle changes and preventive measures are most likely to keep you healthy. Ask your health care provider for more information. WEIGHT AND DIET  Eat a healthy diet  Be sure to include plenty of vegetables, fruits, low-fat dairy products, and lean protein.  Do not eat a lot of foods high in solid fats, added sugars, or salt.  Get regular exercise. This is one of the most important things you can do for your health.  Most adults should exercise for at least 150 minutes each week. The exercise should increase your heart rate and make you sweat (moderate-intensity exercise).  Most adults should also do strengthening exercises at least twice a week. This is in addition to the moderate-intensity exercise.  Maintain a healthy weight  Body mass index (BMI) is a measurement that can be used to identify possible weight problems. It estimates body fat based on height and weight. Your health care provider can help determine your BMI and help you achieve or maintain a healthy weight.  For females 20 years of age and older:   A BMI below 18.5 is considered underweight.  A BMI of 18.5 to 24.9 is normal.  A BMI of 25 to 29.9 is considered overweight.  A BMI of 30 and above is considered obese.  Watch levels of cholesterol and blood lipids  You should start having your blood tested for lipids and cholesterol at 59 years of age, then have this test every 5 years.  You may need to have your cholesterol levels checked more often if:  Your lipid  or cholesterol levels are high.  You are older than 59 years of age.  You are at high risk for heart disease.  CANCER SCREENING   Lung Cancer  Lung cancer screening is recommended for adults 55-80 years old who are at high risk for lung cancer because of a history of smoking.  A yearly low-dose CT scan of the lungs is recommended for people who:  Currently smoke.  Have quit within the past 15 years.  Have at least a 30-pack-year history of smoking. A pack year is smoking an average of one pack of cigarettes a day for 1 year.  Yearly screening should continue until it has been 15 years since you quit.  Yearly screening should stop if you develop a health problem that would prevent you from having lung cancer treatment.  Breast Cancer  Practice breast self-awareness. This means understanding how your breasts normally appear and feel.  It also means doing regular breast self-exams. Let your health care provider know about any changes, no matter how small.  If you are in your 20s or 30s, you should have a clinical breast exam (CBE) by a health care provider every 1-3 years as part of a regular health exam.  If you are 40 or older, have a CBE every year. Also consider having a breast X-ray (mammogram) every year.  If you have a family history of breast cancer, talk to your health care provider about genetic screening.  If you   are at high risk for breast cancer, talk to your health care provider about having an MRI and a mammogram every year.  Breast cancer gene (BRCA) assessment is recommended for women who have family members with BRCA-related cancers. BRCA-related cancers include:  Breast.  Ovarian.  Tubal.  Peritoneal cancers.  Results of the assessment will determine the need for genetic counseling and BRCA1 and BRCA2 testing. Cervical Cancer Your health care provider may recommend that you be screened regularly for cancer of the pelvic organs (ovaries, uterus, and  vagina). This screening involves a pelvic examination, including checking for microscopic changes to the surface of your cervix (Pap test). You may be encouraged to have this screening done every 3 years, beginning at age 21.  For women ages 30-65, health care providers may recommend pelvic exams and Pap testing every 3 years, or they may recommend the Pap and pelvic exam, combined with testing for human papilloma virus (HPV), every 5 years. Some types of HPV increase your risk of cervical cancer. Testing for HPV may also be done on women of any age with unclear Pap test results.  Other health care providers may not recommend any screening for nonpregnant women who are considered low risk for pelvic cancer and who do not have symptoms. Ask your health care provider if a screening pelvic exam is right for you.  If you have had past treatment for cervical cancer or a condition that could lead to cancer, you need Pap tests and screening for cancer for at least 20 years after your treatment. If Pap tests have been discontinued, your risk factors (such as having a new sexual partner) need to be reassessed to determine if screening should resume. Some women have medical problems that increase the chance of getting cervical cancer. In these cases, your health care provider may recommend more frequent screening and Pap tests. Colorectal Cancer  This type of cancer can be detected and often prevented.  Routine colorectal cancer screening usually begins at 59 years of age and continues through 59 years of age.  Your health care provider may recommend screening at an earlier age if you have risk factors for colon cancer.  Your health care provider may also recommend using home test kits to check for hidden blood in the stool.  A small camera at the end of a tube can be used to examine your colon directly (sigmoidoscopy or colonoscopy). This is done to check for the earliest forms of colorectal  cancer.  Routine screening usually begins at age 50.  Direct examination of the colon should be repeated every 5-10 years through 59 years of age. However, you may need to be screened more often if early forms of precancerous polyps or small growths are found. Skin Cancer  Check your skin from head to toe regularly.  Tell your health care provider about any new moles or changes in moles, especially if there is a change in a mole's shape or color.  Also tell your health care provider if you have a mole that is larger than the size of a pencil eraser.  Always use sunscreen. Apply sunscreen liberally and repeatedly throughout the day.  Protect yourself by wearing long sleeves, pants, a wide-brimmed hat, and sunglasses whenever you are outside. HEART DISEASE, DIABETES, AND HIGH BLOOD PRESSURE   High blood pressure causes heart disease and increases the risk of stroke. High blood pressure is more likely to develop in:  People who have blood pressure in the high end   of the normal range (130-139/85-89 mm Hg).  People who are overweight or obese.  People who are African American.  If you are 38-23 years of age, have your blood pressure checked every 3-5 years. If you are 61 years of age or older, have your blood pressure checked every year. You should have your blood pressure measured twice--once when you are at a hospital or clinic, and once when you are not at a hospital or clinic. Record the average of the two measurements. To check your blood pressure when you are not at a hospital or clinic, you can use:  An automated blood pressure machine at a pharmacy.  A home blood pressure monitor.  If you are between 45 years and 39 years old, ask your health care provider if you should take aspirin to prevent strokes.  Have regular diabetes screenings. This involves taking a blood sample to check your fasting blood sugar level.  If you are at a normal weight and have a low risk for diabetes,  have this test once every three years after 59 years of age.  If you are overweight and have a high risk for diabetes, consider being tested at a younger age or more often. PREVENTING INFECTION  Hepatitis B  If you have a higher risk for hepatitis B, you should be screened for this virus. You are considered at high risk for hepatitis B if:  You were born in a country where hepatitis B is common. Ask your health care provider which countries are considered high risk.  Your parents were born in a high-risk country, and you have not been immunized against hepatitis B (hepatitis B vaccine).  You have HIV or AIDS.  You use needles to inject street drugs.  You live with someone who has hepatitis B.  You have had sex with someone who has hepatitis B.  You get hemodialysis treatment.  You take certain medicines for conditions, including cancer, organ transplantation, and autoimmune conditions. Hepatitis C  Blood testing is recommended for:  Everyone born from [birth year] through 1964.  Anyone with known risk factors for hepatitis C. Sexually transmitted infections (STIs)  You should be screened for sexually transmitted infections (STIs) including gonorrhea and chlamydia if:  You are sexually active and are younger than 59 years of age.  You are older than 59 years of age and your health care provider tells you that you are at risk for this type of infection.  Your sexual activity has changed since you were last screened and you are at an increased risk for chlamydia or gonorrhea. Ask your health care provider if you are at risk.  If you do not have HIV, but are at risk, it may be recommended that you take a prescription medicine daily to prevent HIV infection. This is called pre-exposure prophylaxis (PrEP). You are considered at risk if:  You are sexually active and do not regularly use condoms or know the HIV status of your partner(s).  You take drugs by injection.  You are sexually  active with a partner who has HIV. Talk with your health care provider about whether you are at high risk of being infected with HIV. If you choose to begin PrEP, you should first be tested for HIV. You should then be tested every 3 months for as long as you are taking PrEP.  PREGNANCY   If you are premenopausal and you may become pregnant, ask your health care provider about preconception counseling.  If you may  become pregnant, take 400 to 800 micrograms (mcg) of folic acid every day.  If you want to prevent pregnancy, talk to your health care provider about birth control (contraception). OSTEOPOROSIS AND MENOPAUSE   Osteoporosis is a disease in which the bones lose minerals and strength with aging. This can result in serious bone fractures. Your risk for osteoporosis can be identified using a bone density scan.  If you are 61 years of age or older, or if you are at risk for osteoporosis and fractures, ask your health care provider if you should be screened.  Ask your health care provider whether you should take a calcium or vitamin D supplement to lower your risk for osteoporosis.  Menopause may have certain physical symptoms and risks.  Hormone replacement therapy may reduce some of these symptoms and risks. Talk to your health care provider about whether hormone replacement therapy is right for you.  HOME CARE INSTRUCTIONS   Schedule regular health, dental, and eye exams.  Stay current with your immunizations.   Do not use any tobacco products including cigarettes, chewing tobacco, or electronic cigarettes.  If you are pregnant, do not drink alcohol.  If you are breastfeeding, limit how much and how often you drink alcohol.  Limit alcohol intake to no more than 1 drink per day for nonpregnant women. One drink equals 12 ounces of beer, 5 ounces of wine, or 1 ounces of hard liquor.  Do not use street drugs.  Do not share needles.  Ask your health care provider for help if  you need support or information about quitting drugs.  Tell your health care provider if you often feel depressed.  Tell your health care provider if you have ever been abused or do not feel safe at home.   This information is not intended to replace advice given to you by your health care provider. Make sure you discuss any questions you have with your health care provider.   Document Released: 05/17/2011 Document Revised: 11/22/2014 Document Reviewed: 10/03/2013 Elsevier Interactive Patient Education [employer].

## 2016-03-27 LAB — URINALYSIS, MICROSCOPIC ONLY
Bacteria, UA: NONE SEEN [HPF]
Casts: NONE SEEN [LPF]
Crystals: NONE SEEN [HPF]
RBC / HPF: NONE SEEN RBC/HPF (ref <=2)
SQUAMOUS EPITHELIAL / LPF: NONE SEEN [HPF] (ref <=5)
WBC, UA: NONE SEEN WBC/HPF (ref <=5)
Yeast: NONE SEEN [HPF]

## 2016-03-27 LAB — URINE CULTURE: Colony Count: 7000

## 2016-03-27 LAB — HEPATITIS C ANTIBODY: HCV Ab: NEGATIVE

## 2016-03-27 LAB — VITAMIN D 25 HYDROXY (VIT D DEFICIENCY, FRACTURES): Vit D, 25-Hydroxy: 32 ng/mL (ref 30–100)

## 2016-03-29 LAB — HEMOGLOBIN, FINGERSTICK: Hemoglobin, fingerstick: 13.9 g/dL (ref 12.0–16.0)

## 2016-03-30 LAB — IPS PAP TEST WITH HPV

## 2016-04-09 ENCOUNTER — Ambulatory Visit: Payer: Managed Care, Other (non HMO) | Admitting: Obstetrics and Gynecology

## 2016-12-20 ENCOUNTER — Other Ambulatory Visit: Payer: Self-pay | Admitting: Obstetrics and Gynecology

## 2016-12-20 DIAGNOSIS — Z1231 Encounter for screening mammogram for malignant neoplasm of breast: Secondary | ICD-10-CM

## 2017-01-25 ENCOUNTER — Ambulatory Visit
Admission: RE | Admit: 2017-01-25 | Discharge: 2017-01-25 | Disposition: A | Discharge status: Home / Self Care | Payer: Managed Care, Other (non HMO) | Source: Ambulatory Visit | Attending: Obstetrics and Gynecology | Admitting: Obstetrics and Gynecology

## 2017-01-25 DIAGNOSIS — Z1231 Encounter for screening mammogram for malignant neoplasm of breast: Secondary | ICD-10-CM

## 2017-03-28 NOTE — Progress Notes (Signed)
61 y.o. G64P0000 Married Caucasian female here for annual exam.    No vaginal bleeding.  Using OTC moisturizers.  Has some mild stress incontinence.  Can have mild spontaneous leakage.  Does Kegel's.   Mild hot flashes.   Sees dermatology for skin care.  Had abnormal moles removed.   Sister with breast cancer.  Sister had genetic testing which was negative.  3 maternal aunts with breast cancer.  1 paternal aunt and paternal grandmother with breast cancer.   PCP:   Cathlean Cower, MD - [place]  No LMP recorded. Patient has had a hysterectomy.           Sexually active: Yes.    The current method of family planning is status post hysterectomy.    Exercising: Yes.    water aerobics Smoker:  no  Health Maintenance: Pap: 03-26-16 Neg:Neg HR HPV;02-12-15 LSIL:Neg HR HPV History of abnormal Pap:  Yes,  02-12-15 LGSIL:Neg HR HPV. pap 01-30-14 LGSIL;neg HR HPV;colposcopy no acetowhite lesions and no biopsies. History of LEEP in 2009 for CIN III--margins clear. MMG: 01-25-17 Density C/Neg/BiRads1:[facility] Colonoscopy:  01/2008 normal with Dr. Collene Mares. Next due 01/2018.  BMD:   n/a  Result  n/a TDaP:  Discuss w/ PCP Gardasil:   no HIV: per patient done before marriage in 2004 Hep C: 03-26-16 Neg Screening Labs:  Hb today: drawn today   reports that she has never smoked. She has never used smokeless tobacco. She reports that she drinks about 0.6 oz of alcohol per week . She reports that she does not use drugs.  Past Medical History:  Diagnosis Date  . ASCUS with positive high risk HPV 09/2004  . CIN I (cervical intraepithelial neoplasia I) 2005  . Condyloma   . Fibroids   . GERD (gastroesophageal reflux disease)     Past Surgical History:  Procedure Laterality Date  . CERVICAL BIOPSY  W/ LOOP ELECTRODE EXCISION     CIN 3-Clear margins   . MYOMECTOMY  1991  . TOTAL ABDOMINAL HYSTERECTOMY  08/27/2008   Aborted R TLH-TAH ov retained; fibroids recurrent CIN, path-fibroids, CIN I    Current  Outpatient Prescriptions  Medication Sig Dispense Refill  . Multiple Vitamin (MULTIVITAMIN) tablet Take 1 tablet by mouth daily.    . Omega-3 Fatty Acids (FISH OIL PO) Take 1 capsule by mouth as needed.      No current facility-administered medications for this visit.     Family History  Problem Relation Age of Onset  . Congestive Heart Failure Father   . Hypertension Mother   . Breast cancer Sister 42       Dx'd 03/2016  . Breast cancer Maternal Aunt   . Breast cancer Paternal Aunt   . Breast cancer Paternal Grandmother   . Breast cancer Maternal Aunt   . Breast cancer Maternal Aunt     ROS:  Pertinent items are noted in HPI.  Otherwise, a comprehensive ROS was negative.  Exam:   BP 100/72 (BP Location: Right Arm, Patient Position: Sitting, Cuff Size: Normal)   Pulse 80   Resp 16   Ht 5' 6.75" (1.695 m)   Wt 148 lb (67.1 kg)   BMI 23.35 kg/m     General appearance: alert, cooperative and appears stated age Head: Normocephalic, without obvious abnormality, atraumatic Neck: no adenopathy, supple, symmetrical, trachea midline and thyroid with bilateral nodules 1.5 cm each.  Lungs: clear to auscultation bilaterally Breasts: normal appearance, no masses or tenderness, No nipple retraction or dimpling, No  nipple discharge or bleeding, No axillary or supraclavicular adenopathy Heart: regular rate and rhythm Abdomen: soft, non-tender; no masses, no organomegaly Extremities: extremities normal, atraumatic, no cyanosis or edema Skin: Skin color, texture, turgor normal. No rashes or lesions Lymph nodes: Cervical, supraclavicular, and axillary nodes normal. No abnormal inguinal nodes palpated Neurologic: Grossly normal  Pelvic: External genitalia:  no lesions              Urethra:  normal appearing urethra with no masses, tenderness or lesions              Bartholins and Skenes: normal                 Vagina: normal appearing vagina with normal color and discharge, no lesions               Cervix: absent.               Pap taken: Yes.   Bimanual Exam:  Uterus:   Absent.               Adnexa: no mass, fullness, tenderness              Rectal exam: Yes.  .  Confirms.              Anus:  normal sphincter tone, no lesions  Chaperone was present for exam.  Assessment:   Well woman visit with normal exam. Status post robotic TLH.  Ovaries retained.  Hx VAIN I.  Hx prior CIN III.  Negative HR HPV status on last pap. FH breast cancer.   Sister tested negative for genetic mutations.  Off ERT.   Enlarged thryroid.   Plan: Mammogram screening discussed. Recommended self breast awareness. Pap and HR HPV as above. Guidelines for Calcium, Vitamin D, regular exercise program including cardiovascular and weight bearing exercise. Routine labs including TFTs. Thyroid ultrasound.  Follow up annually and prn.   After visit summary provided.

## 2017-03-30 ENCOUNTER — Encounter: Payer: Self-pay | Admitting: Obstetrics and Gynecology

## 2017-03-30 ENCOUNTER — Ambulatory Visit (INDEPENDENT_AMBULATORY_CARE_PROVIDER_SITE_OTHER): Payer: Managed Care, Other (non HMO) | Admitting: Obstetrics and Gynecology

## 2017-03-30 VITALS — BP 100/72 | HR 80 | Resp 16 | Ht 66.75 in | Wt 148.0 lb

## 2017-03-30 DIAGNOSIS — Z01419 Encounter for gynecological examination (general) (routine) without abnormal findings: Secondary | ICD-10-CM

## 2017-03-30 DIAGNOSIS — E049 Nontoxic goiter, unspecified: Secondary | ICD-10-CM

## 2017-03-30 LAB — COMPREHENSIVE METABOLIC PANEL
ALT: 10 U/L (ref 6–29)
AST: 17 U/L (ref 10–35)
Albumin: 4.1 g/dL (ref 3.6–5.1)
Alkaline Phosphatase: 83 U/L (ref 33–130)
BILIRUBIN TOTAL: 0.7 mg/dL (ref 0.2–1.2)
BUN: 13 mg/dL (ref 7–25)
CO2: 28 mmol/L (ref 20–31)
CREATININE: 0.92 mg/dL (ref 0.50–1.05)
Calcium: 9.2 mg/dL (ref 8.6–10.4)
Chloride: 104 mmol/L (ref 98–110)
Glucose, Bld: 75 mg/dL (ref 65–99)
Potassium: 4.3 mmol/L (ref 3.5–5.3)
Sodium: 140 mmol/L (ref 135–146)
TOTAL PROTEIN: 6.7 g/dL (ref 6.1–8.1)

## 2017-03-30 LAB — CBC
HCT: 41.3 % (ref 35.0–45.0)
Hemoglobin: 13.5 g/dL (ref 11.7–15.5)
MCH: 30.2 pg (ref 27.0–33.0)
MCHC: 32.7 g/dL (ref 32.0–36.0)
MCV: 92.4 fL (ref 80.0–100.0)
MPV: 10.5 fL (ref 7.5–12.5)
PLATELETS: 243 10*3/uL (ref 140–400)
RBC: 4.47 MIL/uL (ref 3.80–5.10)
RDW: 13.6 % (ref 11.0–15.0)
WBC: 5.1 10*3/uL (ref 3.8–10.8)

## 2017-03-30 LAB — LIPID PANEL
Cholesterol: 180 mg/dL (ref <200)
HDL: 73 mg/dL (ref >50)
LDL CALC: 88 mg/dL (ref <100)
Total CHOL/HDL Ratio: 2.5 Ratio (ref <5.0)
Triglycerides: 95 mg/dL (ref <150)
VLDL: 19 mg/dL (ref <30)

## 2017-03-30 LAB — THYROID PANEL WITH TSH
Free Thyroxine Index: 2.2 (ref 1.4–3.8)
T3 Uptake: 30 % (ref 22–35)
T4, Total: 7.2 ug/dL (ref 4.5–12.0)
TSH: 2.29 mIU/L

## 2017-03-30 NOTE — Progress Notes (Signed)
Scheduled patient for thyroid ultrasound on 04/07/2017 at 11:15 am with 10:55 am arrive at [place] location. Placed in imaging hold.

## 2017-03-30 NOTE — Patient Instructions (Signed)

## 2017-03-31 LAB — VITAMIN D 25 HYDROXY (VIT D DEFICIENCY, FRACTURES): Vit D, 25-Hydroxy: 31 ng/mL (ref 30–100)

## 2017-04-01 LAB — IPS PAP TEST WITH HPV

## 2017-04-07 ENCOUNTER — Ambulatory Visit
Admission: RE | Admit: 2017-04-07 | Discharge: 2017-04-07 | Disposition: A | Discharge status: Home / Self Care | Payer: Managed Care, Other (non HMO) | Source: Ambulatory Visit | Attending: Obstetrics and Gynecology | Admitting: Obstetrics and Gynecology

## 2017-04-07 DIAGNOSIS — E049 Nontoxic goiter, unspecified: Secondary | ICD-10-CM

## 2017-04-08 ENCOUNTER — Telehealth: Payer: Self-pay | Admitting: Obstetrics and Gynecology

## 2017-04-08 DIAGNOSIS — E042 Nontoxic multinodular goiter: Secondary | ICD-10-CM

## 2017-04-08 NOTE — Telephone Encounter (Signed)
Phone call to patient to discuss her thyroid ultrasound.  No details left.  I asked the patient to return by call and I did give her my personal cell number.   She has bilateral thyroid nodules.  The right nodule does merit biopsy.   I will need to refer her to an endocrinologist who can make appropriate recommendations for her care.

## 2017-04-09 NOTE — Telephone Encounter (Signed)
Late entry for phone call from the patient to me on my cell phone last night.  I gave her results from the thyroid ultrasound and the recommending for the thyroid biopsy.  I have placed an order for her to see Dr. Chalmers Cater for further consultation and recommendations regarding the biopsy.  Please contact her office to make this appointment so there is no significant delay in the patient's care.

## 2017-04-12 NOTE — Telephone Encounter (Signed)
Left message with referral coordinator to call Sharee Pimple at [phone].

## 2017-04-12 NOTE — Telephone Encounter (Signed)
Spoke with Tobin Chad at Dr. Almetta Lovely office. Request copy of OV notes, labs, demographics and imaging to be faxed to [phone] for review and scheduling.   Requested information faxed to [place] at [phone].  Cc: Theresia Lo

## 2017-04-13 NOTE — Telephone Encounter (Signed)
Per review of EPIC, patient scheduled with Dr. Chalmers Cater on 04/14/17 at 11am. Will close encounter.  Routing to provider for final review. Patient is agreeable to disposition. Will close encounter.  Cc: Lerry Liner

## 2017-04-18 ENCOUNTER — Other Ambulatory Visit: Payer: Self-pay | Admitting: Endocrinology

## 2017-04-18 DIAGNOSIS — E049 Nontoxic goiter, unspecified: Secondary | ICD-10-CM

## 2017-04-20 ENCOUNTER — Other Ambulatory Visit (HOSPITAL_COMMUNITY)
Admission: RE | Admit: 2017-04-20 | Discharge: 2017-04-20 | Disposition: A | Discharge status: Home / Self Care | Payer: Managed Care, Other (non HMO) | Source: Ambulatory Visit | Attending: General Surgery | Admitting: General Surgery

## 2017-04-20 ENCOUNTER — Ambulatory Visit
Admission: RE | Admit: 2017-04-20 | Discharge: 2017-04-20 | Disposition: A | Discharge status: Home / Self Care | Payer: Managed Care, Other (non HMO) | Source: Ambulatory Visit | Attending: Endocrinology | Admitting: Endocrinology

## 2017-04-20 DIAGNOSIS — E041 Nontoxic single thyroid nodule: Secondary | ICD-10-CM | POA: Insufficient documentation

## 2017-04-20 DIAGNOSIS — E049 Nontoxic goiter, unspecified: Secondary | ICD-10-CM

## 2017-08-01 ENCOUNTER — Ambulatory Visit (INDEPENDENT_AMBULATORY_CARE_PROVIDER_SITE_OTHER): Payer: Managed Care, Other (non HMO) | Admitting: Obstetrics and Gynecology

## 2017-08-01 ENCOUNTER — Telehealth: Payer: Self-pay | Admitting: Internal Medicine

## 2017-08-01 ENCOUNTER — Encounter: Payer: Self-pay | Admitting: Obstetrics and Gynecology

## 2017-08-01 VITALS — BP 100/60 | HR 84 | Temp 98.2°F | Resp 16 | Wt 154.0 lb

## 2017-08-01 DIAGNOSIS — R3915 Urgency of urination: Secondary | ICD-10-CM

## 2017-08-01 DIAGNOSIS — R350 Frequency of micturition: Secondary | ICD-10-CM

## 2017-08-01 DIAGNOSIS — R300 Dysuria: Secondary | ICD-10-CM

## 2017-08-01 LAB — POCT URINALYSIS DIPSTICK
Bilirubin, UA: NEGATIVE
GLUCOSE UA: NEGATIVE
Ketones, UA: NEGATIVE
Nitrite, UA: NEGATIVE
PROTEIN UA: NEGATIVE
UROBILINOGEN UA: NEGATIVE U/dL — AB
pH, UA: 6.5 (ref 5.0–8.0)

## 2017-08-01 MED ORDER — SULFAMETHOXAZOLE-TRIMETHOPRIM 800-160 MG PO TABS: 1.0000 | ORAL_TABLET | Freq: Two times a day (BID) | ORAL | 0 refills | Status: DC

## 2017-08-01 MED ORDER — PHENAZOPYRIDINE HCL 200 MG PO TABS: 200.0000 mg | ORAL_TABLET | Freq: Three times a day (TID) | ORAL | 0 refills | Status: DC | PRN

## 2017-08-01 NOTE — Telephone Encounter (Signed)
Patient is requesting an appointment. She has not been seen since 2014. Her last 2 appointment one was a no show the other a cancel appointment. Please advise if you accept her as a patient.

## 2017-08-01 NOTE — Progress Notes (Signed)
GYNECOLOGY  VISIT   HPI: 61 y.o.   Married  Caucasian  female   G0P0000 with No LMP recorded. Patient has had a hysterectomy.   here c/o dysuria, urinary frequency and urgency  X 2 days On Friday she noticed some lower back pain. Yesterday about mid day started having mild urinary frequency, urgency and dysuria. The dysuria was bad during the day yesterday. Last night she felt better. This morning she started having some burning, urgency and frequency again. Not as bad as yesterday or an hour ago.  No fever, no flank pain, no gross hematuria.   GYNECOLOGIC HISTORY: No LMP recorded. Patient has had a hysterectomy. Contraception:hysterectomy  Menopausal hormone therapy: none         OB History    Gravida Para Term Preterm AB Living   0 0 0 0 0 0   SAB TAB Ectopic Multiple Live Births   0 0 0 0           Patient Active Problem List   Diagnosis Date Noted  . Lower back pain 09/28/2013  . Preventative health care 09/28/2013  . Vaginitis and vulvovaginitis 09/28/2013  . Urinary urgency 09/28/2013  . INCISIONAL HERNIA 08/28/2010  . GERD 08/05/2007    Past Medical History:  Diagnosis Date  . ASCUS with positive high risk HPV 09/2004  . CIN I (cervical intraepithelial neoplasia I) 2005  . Condyloma   . Fibroids   . GERD (gastroesophageal reflux disease)     Past Surgical History:  Procedure Laterality Date  . CERVICAL BIOPSY  W/ LOOP ELECTRODE EXCISION     CIN 3-Clear margins   . MYOMECTOMY  1991  . TOTAL ABDOMINAL HYSTERECTOMY  08/27/2008   Aborted R TLH-TAH ov retained; fibroids recurrent CIN, path-fibroids, CIN I    Current Outpatient Prescriptions  Medication Sig Dispense Refill  . Multiple Vitamin (MULTIVITAMIN) tablet Take 1 tablet by mouth daily.    . Omega-3 Fatty Acids (FISH OIL PO) Take 1 capsule by mouth as needed.      No current facility-administered medications for this visit.      ALLERGIES: Patient has no known allergies.  Family History  Problem  Relation Age of Onset  . Congestive Heart Failure Father   . Hypertension Mother   . Breast cancer Sister 67       Dx'd 03/2016  . Breast cancer Maternal Aunt   . Breast cancer Paternal Aunt   . Breast cancer Paternal Grandmother   . Breast cancer Maternal Aunt   . Breast cancer Maternal Aunt     Social History   Social History  . Marital status: Married    Spouse name: N/A  . Number of children: 0  . Years of education: N/A   Occupational History  . SOFTWARE SUPPORT   . [employer]   Social History Main Topics  . Smoking status: Never Smoker  . Smokeless tobacco: Never Used  . Alcohol use 0.6 oz/week    1 Standard drinks or equivalent per week     Comment: rarely drinks  . Drug use: No  . Sexual activity: Yes    Partners: Male    Birth control/ protection: Surgical     Comment: Hysterectomy   Other Topics Concern  . Not on file   Social History Narrative   No siblings with heart problems. Works in English as a second language teacher.    Review of Systems  Constitutional: Negative.   HENT: Negative.   Eyes: Negative.  Respiratory: Negative.   Cardiovascular: Negative.   Gastrointestinal: Negative.   Genitourinary: Positive for dysuria, frequency and urgency.       Loss of urine with sneeze or cough  Musculoskeletal: Negative.   Skin: Negative.   Neurological: Negative.   Endo/Heme/Allergies: Negative.   Psychiatric/Behavioral: Negative.     PHYSICAL EXAMINATION:    BP 100/60 (BP Location: Right Arm, Patient Position: Sitting, Cuff Size: Normal)   Pulse 84   Temp 98.2 F (36.8 C)   Resp 16   Wt 154 lb (69.9 kg)   BMI 24.30 kg/m     General appearance: alert, cooperative and appears stated age Abdomen: soft, non-tender; non distended, no masses,  no organomegaly CVA: not tender  Urine dip: 3+blood, 3+leuk  ASSESSMENT Cystitis    PLAN Send urine for ua, c&s Treat with bactrim and pyridium Call with fevers, flank pain, if not  improved in 48 hours, or with any other concerns   An After Visit Summary was printed and given to the patient.  CC: Dr Quincy Simmonds

## 2017-08-01 NOTE — Telephone Encounter (Signed)
Ok, but please let pt know noly if it is important, that I do not prescribe schedule II or higher narcotic for chronic pain

## 2017-08-01 NOTE — Patient Instructions (Signed)

## 2017-08-02 LAB — URINALYSIS, MICROSCOPIC ONLY: Casts: NONE SEEN /lpf

## 2017-08-02 LAB — URINE CULTURE

## 2017-08-02 NOTE — Telephone Encounter (Signed)
Patient has been informed. She understood. She has an appointment for 9/25@4pm .

## 2017-08-03 ENCOUNTER — Telehealth: Payer: Self-pay

## 2017-08-03 NOTE — Telephone Encounter (Signed)
If she doesn't do a referral, she needs to return for a repeat urinalysis secondary to the amount of hematuria. You can make a f/u appointment with Dr Quincy Simmonds. CC: Dr Quincy Simmonds

## 2017-08-03 NOTE — Telephone Encounter (Signed)
Spoke with patient. Advised of message as seen below from [place]. Patient is agreeable and verbalizes understanding. Reports she is feeling much better. Not currently having symptoms. Will stop taking antibiotics at this time. Patient declines referral at this time. States she would like to consider referral and return call if she would like to proceed.

## 2017-08-03 NOTE — Telephone Encounter (Signed)
-----   Message from Salvadore Dom, MD sent at 08/02/2017  6:44 PM EDT ----- Please call the patient and let her know that her urine culture was negative for infection. She did have waxing and waning symptoms. She does have a significant amount of RBC and WBC in her urine. This can be from issues other than infection (ie stones or IC).  Please check on her symptoms. Tell her she can stop her antibiotics and see how quickly you can get her in to see Urology.   CC: Dr Quincy Simmonds

## 2017-08-04 ENCOUNTER — Ambulatory Visit (INDEPENDENT_AMBULATORY_CARE_PROVIDER_SITE_OTHER): Payer: Managed Care, Other (non HMO) | Admitting: Obstetrics and Gynecology

## 2017-08-04 ENCOUNTER — Encounter: Payer: Self-pay | Admitting: Obstetrics and Gynecology

## 2017-08-04 VITALS — BP 112/66 | HR 88 | Temp 98.1°F | Ht 66.5 in | Wt 153.2 lb

## 2017-08-04 DIAGNOSIS — IMO0002 Reserved for concepts with insufficient information to code with codable children: Secondary | ICD-10-CM

## 2017-08-04 DIAGNOSIS — N359 Urethral stricture, unspecified: Secondary | ICD-10-CM

## 2017-08-04 DIAGNOSIS — R319 Hematuria, unspecified: Secondary | ICD-10-CM

## 2017-08-04 LAB — POCT URINALYSIS DIPSTICK
BILIRUBIN UA: NEGATIVE
GLUCOSE UA: NEGATIVE
Ketones, UA: NEGATIVE
Leukocytes, UA: NEGATIVE
Nitrite, UA: NEGATIVE
PH UA: 5 (ref 5.0–8.0)
Protein, UA: NEGATIVE
Urobilinogen, UA: 0.2 E.U./dL

## 2017-08-04 NOTE — Telephone Encounter (Signed)
Please schedule a brief follow up visit with me to do a urine dip.  If it is positive for red or white blood cells, I will do a catheter specimen urine collection. Red and white blood cells in the urine can occur with infection, contamination from normal vaginal discharge, and urinary tract disease.  Follow up is indicated and hopefully we will just need to do a urine dip.  Please schedule an appointment with me so I have time to discuss results and do cath if needed.  Cc- Dr. Talbert Nan

## 2017-08-04 NOTE — Progress Notes (Signed)
GYNECOLOGY  VISIT   HPI: 61 y.o.   Married  Caucasian  female   G0P0000 with No LMP recorded. Patient has had a hysterectomy.   here for follow up urine specimen.  Office visit on 08/01/17 with Dr. Talbert Nan for urinary frequency and dysuria. Had urine dip on showing 3+ leukocytes.  Urine micro positive for > 30 WBC and > 30 RBC. Final UC negative.  Was treated with Bactrim DS which was discontinued after UC returned negative.  She feels back to normal now.   Hx occasional UTI.  No hx stones.  Saw urology 20 years ago for hematuria.  Had negative work up.   Urine Dip: Moderate RBCs Temp: 98.1  GYNECOLOGIC HISTORY: No LMP recorded. Patient has had a hysterectomy. Contraception:  Hysterectomy Menopausal hormone therapy:  none Last mammogram:  01-25-17 Density C/Neg/BiRads1:TBC Last pap smear:  03-26-16 Neg:Neg HR HPV;02-12-15 LSIL:Neg HR HPV         OB History    Gravida Para Term Preterm AB Living   0 0 0 0 0 0   SAB TAB Ectopic Multiple Live Births   0 0 0 0           Patient Active Problem List   Diagnosis Date Noted  . Lower back pain 09/28/2013  . Preventative health care 09/28/2013  . Vaginitis and vulvovaginitis 09/28/2013  . Urinary urgency 09/28/2013  . INCISIONAL HERNIA 08/28/2010  . GERD 08/05/2007    Past Medical History:  Diagnosis Date  . ASCUS with positive high risk HPV 09/2004  . CIN I (cervical intraepithelial neoplasia I) 2005  . Condyloma   . Fibroids   . GERD (gastroesophageal reflux disease)     Past Surgical History:  Procedure Laterality Date  . CERVICAL BIOPSY  W/ LOOP ELECTRODE EXCISION     CIN 3-Clear margins   . MYOMECTOMY  1991  . TOTAL ABDOMINAL HYSTERECTOMY  08/27/2008   Aborted R TLH-TAH ov retained; fibroids recurrent CIN, path-fibroids, CIN I    Current Outpatient Prescriptions  Medication Sig Dispense Refill  . Multiple Vitamin (MULTIVITAMIN) tablet Take 1 tablet by mouth daily.    . Omega-3 Fatty Acids (FISH OIL PO) Take  1 capsule by mouth as needed.      No current facility-administered medications for this visit.      ALLERGIES: Patient has no known allergies.  Family History  Problem Relation Age of Onset  . Congestive Heart Failure Father   . Hypertension Mother   . Breast cancer Sister 52       Dx'd 03/2016  . Breast cancer Maternal Aunt   . Breast cancer Paternal Aunt   . Breast cancer Paternal Grandmother   . Breast cancer Maternal Aunt   . Breast cancer Maternal Aunt     Social History   Social History  . Marital status: Married    Spouse name: N/A  . Number of children: 0  . Years of education: N/A   Occupational History  . SOFTWARE SUPPORT   . [employer]   Social History Main Topics  . Smoking status: Never Smoker  . Smokeless tobacco: Never Used  . Alcohol use 0.6 oz/week    1 Standard drinks or equivalent per week     Comment: rarely drinks  . Drug use: No  . Sexual activity: Yes    Partners: Male    Birth control/ protection: Surgical     Comment: Hysterectomy   Other Topics Concern  . Not  on file   Social History Narrative   No siblings with heart problems. Works in English as a second language teacher.    ROS:  Pertinent items are noted in HPI.  PHYSICAL EXAMINATION:    BP 112/66 (BP Location: Right Arm, Patient Position: Sitting, Cuff Size: Normal)   Pulse 88   Temp 98.1 F (36.7 C) (Oral)   Ht 5' 6.5" (1.689 m)   Wt 153 lb 3.2 oz (69.5 kg)   BMI 24.36 kg/m     General appearance: alert, cooperative and appears stated age   Pelvic: External genitalia:  no lesions              Urethra:  normal appearing urethra with no masses, tenderness or lesions.                Bimanual Exam:  Uterus:  normal size, contour, position, consistency, mobility, non-tender              Adnexa: no mass, fullness, tenderness          Catheterization Verbal consent for procedure.  Sterile prep with betadine.  Cath kit used and attempt to pass catheter  unsuccessful. No complications.   Chaperone was present for exam.  ASSESSMENT  Microscopic hematuria.  Dysuria resolved. Urethral stenosis.   PLAN  Discussed hematuria and potential etiologies. Will refer to [facility].    An After Visit Summary was printed and given to the patient.  __15____ minutes face to face time of which over 50% was spent in counseling.

## 2017-08-04 NOTE — Telephone Encounter (Signed)
Spoke with patient. Advised of message as seen below from [place]. Patient is agreeable. Appointment scheduled for 1:15 pm today with Dr.Silva. Patient is agreeable to date and time.  Routing to provider for final review. Patient agreeable to disposition. Will close encounter.

## 2017-08-04 NOTE — Progress Notes (Addendum)
Spoke with triage nurse at [facility] who requests OV notes and labs be faxed to [phone]. OV notes and labs from 9/17 and 9/20 along with demographics faxed to [phone]. Triage will contact the patient directly to schedule a work in appointment. Advised this needs to be done as soon as possible. Will follow up with [facility] to ensure this is scheduled.

## 2017-08-05 NOTE — Progress Notes (Signed)
Spoke with [facility]. Patient is scheduled to be seen this morning 08/05/2017 at 10 am for evaluation.

## 2017-08-09 ENCOUNTER — Ambulatory Visit: Payer: Managed Care, Other (non HMO) | Admitting: Internal Medicine

## 2017-12-28 ENCOUNTER — Other Ambulatory Visit: Payer: Self-pay | Admitting: Obstetrics and Gynecology

## 2017-12-28 DIAGNOSIS — Z139 Encounter for screening, unspecified: Secondary | ICD-10-CM

## 2018-01-26 ENCOUNTER — Ambulatory Visit
Admission: RE | Admit: 2018-01-26 | Discharge: 2018-01-26 | Disposition: A | Discharge status: Home / Self Care | Payer: Managed Care, Other (non HMO) | Source: Ambulatory Visit | Attending: Obstetrics and Gynecology | Admitting: Obstetrics and Gynecology

## 2018-01-26 DIAGNOSIS — Z139 Encounter for screening, unspecified: Secondary | ICD-10-CM

## 2018-04-07 ENCOUNTER — Encounter: Payer: Self-pay | Admitting: Obstetrics and Gynecology

## 2018-04-07 ENCOUNTER — Other Ambulatory Visit: Payer: Self-pay

## 2018-04-07 ENCOUNTER — Ambulatory Visit (INDEPENDENT_AMBULATORY_CARE_PROVIDER_SITE_OTHER): Payer: Managed Care, Other (non HMO) | Admitting: Obstetrics and Gynecology

## 2018-04-07 ENCOUNTER — Other Ambulatory Visit (HOSPITAL_COMMUNITY)
Admission: RE | Admit: 2018-04-07 | Discharge: 2018-04-07 | Disposition: A | Discharge status: Home / Self Care | Payer: Managed Care, Other (non HMO) | Source: Ambulatory Visit | Attending: Obstetrics and Gynecology | Admitting: Obstetrics and Gynecology

## 2018-04-07 VITALS — BP 100/60 | HR 68 | Resp 16 | Ht 66.5 in | Wt 148.0 lb

## 2018-04-07 DIAGNOSIS — Z90710 Acquired absence of both cervix and uterus: Secondary | ICD-10-CM | POA: Diagnosis not present

## 2018-04-07 DIAGNOSIS — Z803 Family history of malignant neoplasm of breast: Secondary | ICD-10-CM | POA: Diagnosis not present

## 2018-04-07 DIAGNOSIS — Z86001 Personal history of in-situ neoplasm of cervix uteri: Secondary | ICD-10-CM | POA: Insufficient documentation

## 2018-04-07 DIAGNOSIS — Z1151 Encounter for screening for human papillomavirus (HPV): Secondary | ICD-10-CM | POA: Diagnosis not present

## 2018-04-07 DIAGNOSIS — Z01419 Encounter for gynecological examination (general) (routine) without abnormal findings: Secondary | ICD-10-CM | POA: Diagnosis not present

## 2018-04-07 NOTE — Progress Notes (Signed)
62 y.o. G68P0000 Married Caucasian female here for annual exam.    Some vaginal dryness.  Using water based lubricant.   Feels like her thyroid is getting larger.  Has an appointment with Dr. Chalmers Cater next week.   PCP: Dr. Cathlean Cower   Endocrinology:  Dr. Chalmers Cater  Patient's last menstrual period was 11/16/2007 (within months).           Sexually active: Yes.    The current method of family planning is status post hysterectomy.    Exercising: Yes.    water aerobics and walking Smoker:  no  Health Maintenance: Pap:  03/30/17 Neg:Neg HR HPV5-12-17 Neg:Neg HR HPV History of abnormal Pap:  Yes, 02-12-15 LGSIL:Neg HR HPV. pap 01-30-14 LGSIL;neg HR HPV;colposcopy no acetowhite lesions and no biopsies. History of LEEP in 2009 for CIN III--margins clear. MMG:  01/26/18 BIRADS 1 negative/density c Colonoscopy:  01/2008 Normal with Dr. Collene Mares -- patient due BMD:   n/a  Result  n/a TDaP:  unsure Gardasil:   n/a HIV: negative years ago Hep C: 03/26/16 negative Screening Labs: discuss today   reports that she has never smoked. She has never used smokeless tobacco. She reports that she drinks about 0.6 oz of alcohol per week. She reports that she does not use drugs.  Past Medical History:  Diagnosis Date  . ASCUS with positive high risk HPV 09/2004  . CIN I (cervical intraepithelial neoplasia I) 2005  . Condyloma   . Fibroids   . GERD (gastroesophageal reflux disease)     Past Surgical History:  Procedure Laterality Date  . CERVICAL BIOPSY  W/ LOOP ELECTRODE EXCISION     CIN 3-Clear margins   . MYOMECTOMY  1991  . TOTAL ABDOMINAL HYSTERECTOMY  08/27/2008   Aborted R TLH-TAH ov retained; fibroids recurrent CIN, path-fibroids, CIN I    Current Outpatient Medications  Medication Sig Dispense Refill  . Multiple Vitamin (MULTIVITAMIN) tablet Take 1 tablet by mouth daily.    . Omega-3 Fatty Acids (FISH OIL PO) Take 1 capsule by mouth as needed.      No current facility-administered medications  for this visit.     Family History  Problem Relation Age of Onset  . Congestive Heart Failure Father   . Hypertension Mother   . Breast cancer Sister 110       Dx'd 03/2016  . Breast cancer Maternal Aunt   . Breast cancer Paternal Aunt   . Breast cancer Paternal Grandmother   . Breast cancer Maternal Aunt   . Breast cancer Maternal Aunt     Review of Systems  Constitutional: Negative.   HENT: Negative.   Eyes: Negative.   Respiratory: Negative.   Cardiovascular: Negative.   Gastrointestinal: Negative.   Endocrine: Negative.   Genitourinary: Negative.   Musculoskeletal: Negative.   Skin: Negative.   Allergic/Immunologic: Negative.   Neurological: Negative.   Hematological: Negative.   Psychiatric/Behavioral: Negative.     Exam:   BP 100/60 (BP Location: Right Arm, Patient Position: Sitting, Cuff Size: Normal)   Pulse 68   Resp 16   Ht 5' 6.5" (1.689 m)   Wt 148 lb (67.1 kg)   LMP 11/16/2007 (Within Months)   BMI 23.53 kg/m     General appearance: alert, cooperative and appears stated age Head: Normocephalic, without obvious abnormality, atraumatic Neck: no adenopathy, supple, symmetrical, trachea midline and thyroid enlarged globally.  Lungs: clear to auscultation bilaterally Breasts: normal appearance, no masses or tenderness, No nipple retraction or dimpling, No  nipple discharge or bleeding, No axillary or supraclavicular adenopathy Heart: regular rate and rhythm Abdomen: soft, non-tender; no masses, no organomegaly Extremities: extremities normal, atraumatic, no cyanosis or edema Skin: Skin color, texture, turgor normal. No rashes or lesions Lymph nodes: Cervical, supraclavicular, and axillary nodes normal. No abnormal inguinal nodes palpated Neurologic: Grossly normal  Pelvic: External genitalia:  no lesions              Urethra:  normal appearing urethra with no masses, tenderness or lesions              Bartholins and Skenes: normal                 Vagina:  normal appearing vagina with normal color and discharge, no lesions              Cervix:  Absent.              Pap taken: Yes.   Bimanual Exam:  Uterus:  Absent.               Adnexa: no mass, fullness, tenderness              Rectal exam: Yes.  .  Confirms.              Anus:  normal sphincter tone, no lesions  Chaperone was present for exam.  Assessment:   Well woman visit with normal exam. Status post robotic TLH. Ovaries retained.  Hx VAIN I. Hx prior CIN III. Negative HR HPV status on last pap. FH breast cancer.   Sister tested negative for genetic mutations.  Off ERT.   Enlarged thryroid.  Status post thyroid biopsy.   Plan: Mammogram screening. Recommended self breast awareness. Pap and HR HPV as above. Guidelines for Calcium, Vitamin D, regular exercise program including cardiovascular and weight bearing exercise. Tyer C breast cancer risk assessment performed - 19.7% lifetime risk.  List of PCPs to patient.  Follow up annually and prn.   After visit summary provided.

## 2018-04-07 NOTE — Patient Instructions (Signed)

## 2018-04-13 LAB — CYTOLOGY - PAP
Diagnosis: NEGATIVE
HPV: NOT DETECTED

## 2018-04-17 ENCOUNTER — Other Ambulatory Visit: Payer: Self-pay | Admitting: Endocrinology

## 2018-04-17 DIAGNOSIS — E049 Nontoxic goiter, unspecified: Secondary | ICD-10-CM

## 2018-04-21 ENCOUNTER — Ambulatory Visit
Admission: RE | Admit: 2018-04-21 | Discharge: 2018-04-21 | Disposition: A | Discharge status: Home / Self Care | Payer: Managed Care, Other (non HMO) | Source: Ambulatory Visit | Attending: Endocrinology | Admitting: Endocrinology

## 2018-04-21 DIAGNOSIS — E049 Nontoxic goiter, unspecified: Secondary | ICD-10-CM

## 2018-12-13 ENCOUNTER — Other Ambulatory Visit: Payer: Self-pay | Admitting: Obstetrics and Gynecology

## 2018-12-13 DIAGNOSIS — Z1231 Encounter for screening mammogram for malignant neoplasm of breast: Secondary | ICD-10-CM

## 2019-01-19 ENCOUNTER — Encounter: Payer: Self-pay | Admitting: Obstetrics and Gynecology

## 2019-01-30 ENCOUNTER — Other Ambulatory Visit: Payer: Self-pay

## 2019-01-30 ENCOUNTER — Ambulatory Visit
Admission: RE | Admit: 2019-01-30 | Discharge: 2019-01-30 | Disposition: A | Discharge status: Home / Self Care | Payer: Managed Care, Other (non HMO) | Source: Ambulatory Visit | Attending: Obstetrics and Gynecology | Admitting: Obstetrics and Gynecology

## 2019-01-30 DIAGNOSIS — Z1231 Encounter for screening mammogram for malignant neoplasm of breast: Secondary | ICD-10-CM

## 2019-04-04 ENCOUNTER — Telehealth: Payer: Self-pay | Admitting: Obstetrics and Gynecology

## 2019-04-04 NOTE — Telephone Encounter (Signed)
Left message on voicemail to call and reschedule cancelled appointment. °

## 2019-04-16 ENCOUNTER — Other Ambulatory Visit: Payer: Self-pay | Admitting: Endocrinology

## 2019-04-16 DIAGNOSIS — E049 Nontoxic goiter, unspecified: Secondary | ICD-10-CM

## 2019-04-25 ENCOUNTER — Ambulatory Visit
Admission: RE | Admit: 2019-04-25 | Discharge: 2019-04-25 | Disposition: A | Discharge status: Home / Self Care | Payer: No Typology Code available for payment source | Source: Ambulatory Visit | Attending: Endocrinology | Admitting: Endocrinology

## 2019-04-25 DIAGNOSIS — E049 Nontoxic goiter, unspecified: Secondary | ICD-10-CM

## 2019-05-02 ENCOUNTER — Other Ambulatory Visit (HOSPITAL_COMMUNITY)
Admission: RE | Admit: 2019-05-02 | Discharge: 2019-05-02 | Disposition: A | Discharge status: Home / Self Care | Payer: No Typology Code available for payment source | Source: Ambulatory Visit | Attending: Obstetrics and Gynecology | Admitting: Obstetrics and Gynecology

## 2019-05-02 ENCOUNTER — Other Ambulatory Visit: Payer: Self-pay

## 2019-05-02 ENCOUNTER — Ambulatory Visit: Payer: Managed Care, Other (non HMO) | Admitting: Obstetrics and Gynecology

## 2019-05-02 ENCOUNTER — Ambulatory Visit (INDEPENDENT_AMBULATORY_CARE_PROVIDER_SITE_OTHER): Payer: No Typology Code available for payment source | Admitting: Obstetrics and Gynecology

## 2019-05-02 ENCOUNTER — Encounter: Payer: Self-pay | Admitting: Obstetrics and Gynecology

## 2019-05-02 VITALS — BP 110/70 | HR 80 | Temp 98.0°F | Resp 14 | Ht 66.75 in | Wt 143.0 lb

## 2019-05-02 DIAGNOSIS — Z23 Encounter for immunization: Secondary | ICD-10-CM | POA: Diagnosis not present

## 2019-05-02 DIAGNOSIS — Z01419 Encounter for gynecological examination (general) (routine) without abnormal findings: Secondary | ICD-10-CM | POA: Diagnosis not present

## 2019-05-02 NOTE — Progress Notes (Signed)
63 y.o. G58P0000 Married Caucasian female here for annual exam.    No problems.  Bladder is better.  No leakage.  No bladder pain.  Bowel function is good.   Thyroid function is still normal.  Just had a thyroid US last week.  Mother passed in March age 32.  Was working from home during pandemic.  Now is back in the office.   PCP: Cathlean Cower, MD   Endocrinology:  Dr. Chalmers Cater.   Patient's last menstrual period was 11/16/2007 (within months).           Sexually active: Yes.    The current method of family planning is status post hysterectomy.    Exercising: Yes.    walking Smoker:  no  Health Maintenance: Pap: 04/07/18 Neg:Neg HR HPV; 03/30/17 Neg:Neg HR HPV; 03-26-16 Neg:Neg HR HPV History of abnormal Pap:  Yes,02-12-15 LGSIL:Neg HR HPV. pap 01-30-14 LGSIL;neg HR HPV;colposcopy no acetowhite lesions and no biopsies. History of LEEP in 2009 for CIN III--margins clear. MMG:  01/30/19 BIRADS 1 negative/density c Colonoscopy:  01/19/19 Diverticulosis, f/u 10 years BMD:   n/a  Result  n/a TDaP:  Patient is due. Gardasil:   n/a HIV: negative in the past Hep C: 03/26/16 Negative Screening Labs: PCP    reports that she has never smoked. She has never used smokeless tobacco. She reports current alcohol use of about 1.0 standard drinks of alcohol per week. She reports that she does not use drugs.  Past Medical History:  Diagnosis Date  . ASCUS with positive high risk HPV 09/2004  . CIN I (cervical intraepithelial neoplasia I) 2005  . Condyloma   . Fibroids   . GERD (gastroesophageal reflux disease)     Past Surgical History:  Procedure Laterality Date  . CERVICAL BIOPSY  W/ LOOP ELECTRODE EXCISION     CIN 3-Clear margins   . MYOMECTOMY  1991  . TOTAL ABDOMINAL HYSTERECTOMY  08/27/2008   Aborted R TLH-TAH ov retained; fibroids recurrent CIN, path-fibroids, CIN I    Current Outpatient Medications  Medication Sig Dispense Refill  . chlorhexidine (PERIDEX) 0.12 % solution RINSE 2  TIMES PER DAY (IN MORNING AND BEFORE BEDTIME) FOR 30 SECONDS    . Multiple Vitamin (MULTIVITAMIN) tablet Take 1 tablet by mouth daily.    . Omega-3 Fatty Acids (FISH OIL PO) Take 1 capsule by mouth as needed.      No current facility-administered medications for this visit.     Family History  Problem Relation Age of Onset  . Congestive Heart Failure Father   . Hypertension Mother   . Breast cancer Sister 54       Dx'd 03/2016  . Breast cancer Maternal Aunt   . Breast cancer Paternal Aunt   . Breast cancer Paternal Grandmother   . Breast cancer Maternal Aunt   . Breast cancer Maternal Aunt     Review of Systems  Constitutional: Negative.   HENT: Negative.   Eyes: Negative.   Respiratory: Negative.   Cardiovascular: Negative.   Gastrointestinal: Negative.   Endocrine: Negative.   Genitourinary: Negative.   Musculoskeletal: Negative.   Skin: Negative.   Allergic/Immunologic: Negative.   Neurological: Negative.   Hematological: Negative.   Psychiatric/Behavioral: Negative.     Exam:   BP 110/70 (BP Location: Left Arm, Patient Position: Sitting, Cuff Size: Normal)   Pulse 80   Temp 98 F (36.7 C) (Temporal)   Resp 14   Ht 5' 6.75" (1.695 m)   Wt 143 lb (  64.9 kg)   LMP 11/16/2007 (Within Months)   BMI 22.57 kg/m     General appearance: alert, cooperative and appears stated age Head: normocephalic, without obvious abnormality, atraumatic Neck: no adenopathy, supple, symmetrical, trachea midline and thyroid enlarged with bilateral nodules. Lungs: clear to auscultation bilaterally Breasts: normal appearance, no masses or tenderness, No nipple retraction or dimpling, No nipple discharge or bleeding, No axillary adenopathy Heart: regular rate and rhythm Abdomen: soft, non-tender; no masses, no organomegaly Extremities: extremities normal, atraumatic, no cyanosis or edema Skin: skin color, texture, turgor normal. No rashes or lesions Lymph nodes: cervical,  supraclavicular, and axillary nodes normal. Neurologic: grossly normal  Pelvic: External genitalia:  no lesions              No abnormal inguinal nodes palpated.              Urethra:  normal appearing urethra with no masses, tenderness or lesions              Bartholins and Skenes: normal                 Vagina: normal appearing vagina with some erythema and orange discharge.              Cervix: absent.               Pap taken: Yes.   Bimanual Exam:  Uterus:   absent              Adnexa: no mass, fullness, tenderness              Rectal exam: Yes.  .  Confirms.              Anus:  normal sphincter tone, no lesions  Chaperone was present for exam.  Assessment:   Well woman visit with gynecologic exam.  Status post robotic TLH. Ovaries retained. Hx VAIN I. Hx prior CIN III. Negative HR HPV status on last pap. FH breast cancer.Sister tested negative for genetic mutations. Off ERT. Enlarged thryroid. Status post thyroid biopsy.   Vaginal atrophy.   Plan: Mammogram screening discussed. Self breast awareness reviewed. Pap and HR HPV as above. Guidelines for Calcium, Vitamin D, regular exercise program including cardiovascular and weight bearing exercise. TDap today.  We discussed atrophy and tx options - water based lubricants, cooking oils, local vaginal estrogens.  She declines estrogens.    Follow up annually and prn.   After visit summary provided.

## 2019-05-02 NOTE — Patient Instructions (Signed)

## 2019-05-07 LAB — CYTOLOGY - PAP
Diagnosis: NEGATIVE
Diagnosis: REACTIVE
HPV: NOT DETECTED

## 2019-07-19 ENCOUNTER — Other Ambulatory Visit: Payer: Self-pay

## 2019-07-19 ENCOUNTER — Encounter: Payer: Self-pay | Admitting: Internal Medicine

## 2019-07-19 ENCOUNTER — Ambulatory Visit (INDEPENDENT_AMBULATORY_CARE_PROVIDER_SITE_OTHER): Payer: No Typology Code available for payment source | Admitting: Internal Medicine

## 2019-07-19 ENCOUNTER — Other Ambulatory Visit (INDEPENDENT_AMBULATORY_CARE_PROVIDER_SITE_OTHER): Payer: No Typology Code available for payment source

## 2019-07-19 ENCOUNTER — Other Ambulatory Visit: Payer: Self-pay | Admitting: Internal Medicine

## 2019-07-19 VITALS — BP 118/78 | HR 72 | Temp 97.7°F | Ht 66.75 in | Wt 142.0 lb

## 2019-07-19 DIAGNOSIS — E611 Iron deficiency: Secondary | ICD-10-CM | POA: Diagnosis not present

## 2019-07-19 DIAGNOSIS — Z23 Encounter for immunization: Secondary | ICD-10-CM

## 2019-07-19 DIAGNOSIS — Z114 Encounter for screening for human immunodeficiency virus [HIV]: Secondary | ICD-10-CM

## 2019-07-19 DIAGNOSIS — E559 Vitamin D deficiency, unspecified: Secondary | ICD-10-CM | POA: Diagnosis not present

## 2019-07-19 DIAGNOSIS — Z0000 Encounter for general adult medical examination without abnormal findings: Secondary | ICD-10-CM | POA: Diagnosis not present

## 2019-07-19 DIAGNOSIS — E538 Deficiency of other specified B group vitamins: Secondary | ICD-10-CM

## 2019-07-19 DIAGNOSIS — E041 Nontoxic single thyroid nodule: Secondary | ICD-10-CM | POA: Insufficient documentation

## 2019-07-19 DIAGNOSIS — N200 Calculus of kidney: Secondary | ICD-10-CM | POA: Insufficient documentation

## 2019-07-19 DIAGNOSIS — E2839 Other primary ovarian failure: Secondary | ICD-10-CM

## 2019-07-19 LAB — LIPID PANEL
Cholesterol: 173 mg/dL (ref 0–200)
HDL: 69.8 mg/dL (ref >39.00)
LDL Cholesterol: 88 mg/dL (ref 0–99)
NonHDL: 103.14
Total CHOL/HDL Ratio: 2
Triglycerides: 74 mg/dL (ref 0.0–149.0)
VLDL: 14.8 mg/dL (ref 0.0–40.0)

## 2019-07-19 LAB — BASIC METABOLIC PANEL
BUN: 17 mg/dL (ref 6–23)
CO2: 29 mEq/L (ref 19–32)
Calcium: 9.5 mg/dL (ref 8.4–10.5)
Chloride: 103 mEq/L (ref 96–112)
Creatinine, Ser: 0.93 mg/dL (ref 0.40–1.20)
GFR: 61.07 mL/min (ref >60.00)
Glucose, Bld: 85 mg/dL (ref 70–99)
Potassium: 4.3 mEq/L (ref 3.5–5.1)
Sodium: 139 mEq/L (ref 135–145)

## 2019-07-19 LAB — CBC WITH DIFFERENTIAL/PLATELET
Basophils Absolute: 0.1 10*3/uL (ref 0.0–0.1)
Basophils Relative: 1.2 % (ref 0.0–3.0)
Eosinophils Absolute: 0.1 10*3/uL (ref 0.0–0.7)
Eosinophils Relative: 1.4 % (ref 0.0–5.0)
HCT: 40.8 % (ref 36.0–46.0)
Hemoglobin: 13.7 g/dL (ref 12.0–15.0)
Lymphocytes Relative: 23.8 % (ref 12.0–46.0)
Lymphs Abs: 1.3 10*3/uL (ref 0.7–4.0)
MCHC: 33.7 g/dL (ref 30.0–36.0)
MCV: 92.8 fl (ref 78.0–100.0)
Monocytes Absolute: 0.6 10*3/uL (ref 0.1–1.0)
Monocytes Relative: 10.3 % (ref 3.0–12.0)
Neutro Abs: 3.4 10*3/uL (ref 1.4–7.7)
Neutrophils Relative %: 63.3 % (ref 43.0–77.0)
Platelets: 226 10*3/uL (ref 150.0–400.0)
RBC: 4.4 Mil/uL (ref 3.87–5.11)
RDW: 13.3 % (ref 11.5–15.5)
WBC: 5.3 10*3/uL (ref 4.0–10.5)

## 2019-07-19 LAB — IBC PANEL
Iron: 151 ug/dL — ABNORMAL HIGH (ref 42–145)
Saturation Ratios: 45.7 % (ref 20.0–50.0)
Transferrin: 236 mg/dL (ref 212.0–360.0)

## 2019-07-19 LAB — URINALYSIS, ROUTINE W REFLEX MICROSCOPIC
Bilirubin Urine: NEGATIVE
Ketones, ur: NEGATIVE
Nitrite: NEGATIVE
Specific Gravity, Urine: 1.01 (ref 1.000–1.030)
Total Protein, Urine: NEGATIVE
Urine Glucose: NEGATIVE
Urobilinogen, UA: 0.2 (ref 0.0–1.0)
pH: 6 (ref 5.0–8.0)

## 2019-07-19 LAB — VITAMIN D 25 HYDROXY (VIT D DEFICIENCY, FRACTURES): VITD: 23.8 ng/mL — ABNORMAL LOW (ref 30.00–100.00)

## 2019-07-19 LAB — HEPATIC FUNCTION PANEL
ALT: 10 U/L (ref 0–35)
AST: 19 U/L (ref 0–37)
Albumin: 4.2 g/dL (ref 3.5–5.2)
Alkaline Phosphatase: 78 U/L (ref 39–117)
Bilirubin, Direct: 0.1 mg/dL (ref 0.0–0.3)
Total Bilirubin: 1.1 mg/dL (ref 0.2–1.2)
Total Protein: 7.3 g/dL (ref 6.0–8.3)

## 2019-07-19 LAB — VITAMIN B12: Vitamin B-12: 396 pg/mL (ref 211–911)

## 2019-07-19 LAB — TSH: TSH: 2.16 u[IU]/mL (ref 0.35–4.50)

## 2019-07-19 MED ORDER — VITAMIN D (ERGOCALCIFEROL) 1.25 MG (50000 UNIT) PO CAPS: 50000.0000 [IU] | ORAL_CAPSULE | ORAL | 0 refills | Status: DC

## 2019-07-19 MED ORDER — ZOSTER VAC RECOMB ADJUVANTED 50 MCG/0.5ML IM SUSR: 0.5000 mL | Freq: Once | INTRAMUSCULAR | 1 refills | Status: AC

## 2019-07-19 NOTE — Progress Notes (Signed)
Subjective:    Patient ID: [name], female    DOB: October 29, 1956, 63 y.o.   MRN: [id number]  HPI  Here for wellness and establish new pt  Overall doing ok;  Pt denies Chest pain, worsening SOB, DOE, wheezing, orthopnea, PND, worsening LE edema, palpitations, dizziness or syncope.  Pt denies neurological change such as new headache, facial or extremity weakness.  Pt denies polydipsia, polyuria, or low sugar symptoms. Pt states overall good compliance with treatment and medications, good tolerability, and has been trying to follow appropriate diet.  Pt denies worsening depressive symptoms, suicidal ideation or panic. No fever, night sweats, wt loss, loss of appetite, or other constitutional symptoms.  Pt states good ability with ADL's, has low fall risk, home safety reviewed and adequate, no other significant changes in hearing or vision, and only occasionally active with exercise. No new complaints Wt Readings from Last 3 Encounters:  07/19/19 142 lb (64.4 kg)  05/02/19 143 lb (64.9 kg)  04/07/18 148 lb (67.1 kg)   BP Readings from Last 3 Encounters:  07/19/19 118/78  05/02/19 110/70  04/07/18 100/60   Past Medical History:  Diagnosis Date  . ASCUS with positive high risk HPV 09/2004  . CIN I (cervical intraepithelial neoplasia I) 2005  . Condyloma   . Fibroids   . GERD (gastroesophageal reflux disease)    Past Surgical History:  Procedure Laterality Date  . CERVICAL BIOPSY  W/ LOOP ELECTRODE EXCISION     CIN 3-Clear margins   . MYOMECTOMY  1991  . TOTAL ABDOMINAL HYSTERECTOMY  08/27/2008   Aborted R TLH-TAH ov retained; fibroids recurrent CIN, path-fibroids, CIN I    reports that she has never smoked. She has never used smokeless tobacco. She reports current alcohol use of about 1.0 standard drinks of alcohol per week. She reports that she does not use drugs. family history includes Breast cancer in her maternal aunt, maternal aunt, maternal aunt, paternal aunt, and paternal  grandmother; Breast cancer (age of onset: 53) in her sister; Congestive Heart Failure in her father; Hypertension in her mother. No Known Allergies Current Outpatient Medications on File Prior to Visit  Medication Sig Dispense Refill  . chlorhexidine (PERIDEX) 0.12 % solution RINSE 2 TIMES PER DAY (IN MORNING AND BEFORE BEDTIME) FOR 30 SECONDS    . Multiple Vitamin (MULTIVITAMIN) tablet Take 1 tablet by mouth daily.    . Omega-3 Fatty Acids (FISH OIL PO) Take 1 capsule by mouth as needed.      No current facility-administered medications on file prior to visit.    Review of Systems Constitutional: Negative for other unusual diaphoresis, sweats, appetite or weight changes HENT: Negative for other worsening hearing loss, ear pain, facial swelling, mouth sores or neck stiffness.   Eyes: Negative for other worsening pain, redness or other visual disturbance.  Respiratory: Negative for other stridor or swelling Cardiovascular: Negative for other palpitations or other chest pain  Gastrointestinal: Negative for worsening diarrhea or loose stools, blood in stool, distention or other pain Genitourinary: Negative for hematuria, flank pain or other change in urine volume.  Musculoskeletal: Negative for myalgias or other joint swelling.  Skin: Negative for other color change, or other wound or worsening drainage.  Neurological: Negative for other syncope or numbness. Hematological: Negative for other adenopathy or swelling Psychiatric/Behavioral: Negative for hallucinations, other worsening agitation, SI, self-injury, or new decreased concentration All other system neg per pt    Objective:   Physical Exam BP 118/78   Pulse  72   Temp 97.7 F (36.5 C) (Oral)   Ht 5' 6.75" (1.695 m)   Wt 142 lb (64.4 kg)   LMP 11/16/2007 (Within Months)   SpO2 98%   BMI 22.41 kg/m  VS noted,  Constitutional: Pt is oriented to person, place, and time. Appears well-developed and well-nourished, in no significant  distress and comfortable Head: Normocephalic and atraumatic  Eyes: Conjunctivae and EOM are normal. Pupils are equal, round, and reactive to light Right Ear: External ear normal without discharge Left Ear: External ear normal without discharge Nose: Nose without discharge or deformity Mouth/Throat: Oropharynx is without other ulcerations and moist  Neck: Normal range of motion. Neck supple. No JVD present. No tracheal deviation present or significant neck LA or mass Cardiovascular: Normal rate, regular rhythm, normal heart sounds and intact distal pulses.   Pulmonary/Chest: WOB normal and breath sounds without rales or wheezing  Abdominal: Soft. Bowel sounds are normal. NT. No HSM  Musculoskeletal: Normal range of motion. Exhibits no edema Lymphadenopathy: Has no other cervical adenopathy.  Neurological: Pt is alert and oriented to person, place, and time. Pt has normal reflexes. No cranial nerve deficit. Motor grossly intact, Gait intact Skin: Skin is warm and dry. No rash noted or new ulcerations Psychiatric:  Has normal mood and affect. Behavior is normal without agitation No other exam findings  Lab Results  Component Value Date   WBC 5.1 03/30/2017   HGB 13.5 03/30/2017   HCT 41.3 03/30/2017   PLT 243 03/30/2017   GLUCOSE 75 03/30/2017   CHOL 180 03/30/2017   TRIG 95 03/30/2017   HDL 73 03/30/2017   LDLCALC 88 03/30/2017   ALT 10 03/30/2017   AST 17 03/30/2017   NA 140 03/30/2017   K 4.3 03/30/2017   CL 104 03/30/2017   CREATININE 0.92 03/30/2017   BUN 13 03/30/2017   CO2 28 03/30/2017   TSH 2.29 03/30/2017      Assessment & Plan:

## 2019-07-19 NOTE — Patient Instructions (Addendum)
You had the flu shot today  You are given the shingles shot prescription to your pharmacy  Please continue all other medications as before, and refills have been done if requested.  Please have the pharmacy call with any other refills you may need.  Please continue your efforts at being more active, low cholesterol diet, and weight control.  You are otherwise up to date with prevention measures today.  Please keep your appointments with your specialists as you may have planned  Please go to the LAB in the Basement (turn left off the elevator) for the tests to be done today  You will be contacted by phone if any changes need to be made immediately.  Otherwise, you will receive a letter about your results with an explanation, but please check with MyChart first.  Please remember to sign up for MyChart if you have not done so, as this will be important to you in the future with finding out test results, communicating by private email, and scheduling acute appointments online when needed.  Please return in 1 year for your yearly visit, or sooner if needed, with Lab testing done 3-5 days before

## 2019-07-20 ENCOUNTER — Encounter: Payer: Self-pay | Admitting: Internal Medicine

## 2019-07-20 ENCOUNTER — Telehealth: Payer: Self-pay | Admitting: Internal Medicine

## 2019-07-20 LAB — HIV ANTIBODY (ROUTINE TESTING W REFLEX): HIV 1&2 Ab, 4th Generation: NONREACTIVE

## 2019-07-20 MED ORDER — CEPHALEXIN 500 MG PO CAPS: 500.0000 mg | ORAL_CAPSULE | Freq: Three times a day (TID) | ORAL | 0 refills | Status: AC

## 2019-07-20 NOTE — Telephone Encounter (Signed)
Ok to let pt know, I reviewed her labs again, and we can go ahead and treat for possible uti - done erx

## 2019-07-20 NOTE — Telephone Encounter (Signed)
Pt has been informed.

## 2019-07-20 NOTE — Telephone Encounter (Signed)
Please to contact pt to schedule a bone density test , thanks

## 2019-07-20 NOTE — Assessment & Plan Note (Signed)

## 2019-09-05 ENCOUNTER — Ambulatory Visit (INDEPENDENT_AMBULATORY_CARE_PROVIDER_SITE_OTHER)
Admission: RE | Admit: 2019-09-05 | Discharge: 2019-09-05 | Disposition: A | Discharge status: Home / Self Care | Payer: No Typology Code available for payment source | Source: Ambulatory Visit | Attending: Internal Medicine | Admitting: Internal Medicine

## 2019-09-05 ENCOUNTER — Other Ambulatory Visit: Payer: Self-pay

## 2019-09-05 DIAGNOSIS — E2839 Other primary ovarian failure: Secondary | ICD-10-CM | POA: Diagnosis not present

## 2019-11-03 ENCOUNTER — Other Ambulatory Visit: Payer: Self-pay | Admitting: Internal Medicine

## 2019-11-05 NOTE — Telephone Encounter (Signed)
No need contd high dose vit d  then plan to change to OTC Vitamin D3 at 2000 units per day, indefinitely.

## 2019-11-06 NOTE — Telephone Encounter (Signed)
Notified pt w/MD response.../lmb 

## 2019-12-17 ENCOUNTER — Other Ambulatory Visit: Payer: Self-pay | Admitting: Obstetrics and Gynecology

## 2019-12-17 DIAGNOSIS — Z1231 Encounter for screening mammogram for malignant neoplasm of breast: Secondary | ICD-10-CM

## 2019-12-27 ENCOUNTER — Other Ambulatory Visit (INDEPENDENT_AMBULATORY_CARE_PROVIDER_SITE_OTHER): Payer: No Typology Code available for payment source

## 2019-12-27 ENCOUNTER — Encounter: Payer: Self-pay | Admitting: Internal Medicine

## 2019-12-27 ENCOUNTER — Ambulatory Visit (INDEPENDENT_AMBULATORY_CARE_PROVIDER_SITE_OTHER): Payer: No Typology Code available for payment source | Admitting: Internal Medicine

## 2019-12-27 DIAGNOSIS — R42 Dizziness and giddiness: Secondary | ICD-10-CM | POA: Diagnosis not present

## 2019-12-27 LAB — CBC WITH DIFFERENTIAL/PLATELET
Basophils Absolute: 0 10*3/uL (ref 0.0–0.1)
Basophils Relative: 0.9 % (ref 0.0–3.0)
Eosinophils Absolute: 0.1 10*3/uL (ref 0.0–0.7)
Eosinophils Relative: 1.2 % (ref 0.0–5.0)
HCT: 41.4 % (ref 36.0–46.0)
Hemoglobin: 13.9 g/dL (ref 12.0–15.0)
Lymphocytes Relative: 22.4 % (ref 12.0–46.0)
Lymphs Abs: 1.2 10*3/uL (ref 0.7–4.0)
MCHC: 33.4 g/dL (ref 30.0–36.0)
MCV: 93.1 fl (ref 78.0–100.0)
Monocytes Absolute: 0.6 10*3/uL (ref 0.1–1.0)
Monocytes Relative: 10.3 % (ref 3.0–12.0)
Neutro Abs: 3.5 10*3/uL (ref 1.4–7.7)
Neutrophils Relative %: 65.2 % (ref 43.0–77.0)
Platelets: 224 10*3/uL (ref 150.0–400.0)
RBC: 4.45 Mil/uL (ref 3.87–5.11)
RDW: 13.2 % (ref 11.5–15.5)
WBC: 5.4 10*3/uL (ref 4.0–10.5)

## 2019-12-27 LAB — COMPREHENSIVE METABOLIC PANEL
ALT: 13 U/L (ref 0–35)
AST: 21 U/L (ref 0–37)
Albumin: 4.3 g/dL (ref 3.5–5.2)
Alkaline Phosphatase: 77 U/L (ref 39–117)
BUN: 12 mg/dL (ref 6–23)
CO2: 30 mEq/L (ref 19–32)
Calcium: 9.4 mg/dL (ref 8.4–10.5)
Chloride: 104 mEq/L (ref 96–112)
Creatinine, Ser: 0.87 mg/dL (ref 0.40–1.20)
GFR: 65.86 mL/min (ref >60.00)
Glucose, Bld: 86 mg/dL (ref 70–99)
Potassium: 4.3 mEq/L (ref 3.5–5.1)
Sodium: 139 mEq/L (ref 135–145)
Total Bilirubin: 0.8 mg/dL (ref 0.2–1.2)
Total Protein: 7.3 g/dL (ref 6.0–8.3)

## 2019-12-27 NOTE — Assessment & Plan Note (Signed)
Acute 5 days of intermittent lightheadedness that occurs when she is up and doing things - none with sitting or laying Never had vertigo, but this sounds like vertigo w/o true spinning Doubt dehydration since she has been eating/drinking normally but will No obvious sinus infection/cold check cbc, cmp Discussed meclizine -she would like to hold off She will let us know if her symptoms do not improve over the next few days.  Advised she may need to come in for evaluation in person

## 2019-12-27 NOTE — Progress Notes (Signed)
Virtual Visit via Video Note  I connected with [name] on 12/27/19 at 11:15 AM EST by a video enabled telemedicine application and verified that I am speaking with the correct person using two identifiers.   I discussed the limitations of evaluation and management by telemedicine and the availability of in person appointments. The patient expressed understanding and agreed to proceed.  Present for the visit:  Myself, Dr Billey Gosling, Bridget Hartshorn.  The patient is currently at home and I am in the office.    No referring provider.    History of Present Illness: Sunday morning she got out of bed and was lightheaded.  She had to hold on to walk.  It got better as the day went on.  She initially thought she may have vertigo, but there was no dizziness, just lightheadedness.  She was ok with laying down or sitting, but was lightheaded when she got up.    She worked from home Monday.  She felt better and was able to take a walk that night.  This whole week she has had intermittent lightheadedness.  She was cleaning last night and felt it when she was bending down.  She has been ok when she goes for walks.     She checked her BP this am at [facility] and it was 107/68.    In the past she was lightheaded when she was dehydrated and is concerned she may be dehydrated.  She is trying to drink more.    She may have had some tingling in her fingers and toes last night when laying bed, but she is not sure.  She has a dull headache.  She denies any other symptoms.    Review of Systems  Constitutional: Negative for chills and fever.  HENT: Negative for congestion, sinus pain and sore throat.   Eyes: Negative for blurred vision and double vision.  Respiratory: Negative for cough, shortness of breath and wheezing.   Cardiovascular: Negative for chest pain, palpitations and leg swelling.  Gastrointestinal: Negative for blood in stool, constipation, diarrhea, melena and nausea.  Neurological: Positive  for tingling (fingers and toes last night while laying in bed) and headaches (dull). Negative for sensory change and focal weakness.     Social History   Socioeconomic History  . Marital status: Married    Spouse name: Not on file  . Number of children: 0  . Years of education: Not on file  . Highest education level: Not on file  Occupational History  . Occupation: SOFTWARE SUPPORT  . Occupation: SOFTWARE SUPPORT    Employer: [employer]  Tobacco Use  . Smoking status: Never Smoker  . Smokeless tobacco: Never Used  Substance and Sexual Activity  . Alcohol use: Yes    Alcohol/week: 1.0 standard drinks    Types: 1 Standard drinks or equivalent per week    Comment: rarely drinks  . Drug use: No  . Sexual activity: Yes    Partners: Male    Birth control/protection: Surgical    Comment: Hysterectomy  Other Topics Concern  . Not on file  Social History Narrative   No siblings with heart problems. Works in English as a second language teacher.   Social Determinants of Health   Financial Resource Strain:   . Difficulty of Paying Living Expenses: Not on file  Food Insecurity:   . Worried About Charity fundraiser in the Last Year: Not on file  . Ran Out of Food in the Last Year: Not on  file  Transportation Needs:   . Film/video editor (Medical): Not on file  . Lack of Transportation (Non-Medical): Not on file  Physical Activity:   . Days of Exercise per Week: Not on file  . Minutes of Exercise per Session: Not on file  Stress:   . Feeling of Stress : Not on file  Social Connections:   . Frequency of Communication with Friends and Family: Not on file  . Frequency of Social Gatherings with Friends and Family: Not on file  . Attends Religious Services: Not on file  . Active Member of Clubs or Organizations: Not on file  . Attends Archivist Meetings: Not on file  . Marital Status: Not on file     Observations/Objective: Appears well in NAD Breathing normally,  speaking in full sentences Skin appear warm and dry  Assessment and Plan:  See Problem List for Assessment and Plan of chronic medical problems.   Follow Up Instructions:    I discussed the assessment and treatment plan with the patient. The patient was provided an opportunity to ask questions and all were answered. The patient agreed with the plan and demonstrated an understanding of the instructions.   The patient was advised to call back or seek an in-person evaluation if the symptoms worsen or if the condition fails to improve as anticipated.    Binnie Rail, MD

## 2020-01-13 ENCOUNTER — Encounter (HOSPITAL_COMMUNITY): Payer: Self-pay

## 2020-01-13 ENCOUNTER — Other Ambulatory Visit: Payer: Self-pay

## 2020-01-13 ENCOUNTER — Emergency Department (HOSPITAL_COMMUNITY)
Admission: EM | Admit: 2020-01-13 | Discharge: 2020-01-14 | Disposition: A | Discharge status: Home / Self Care | Payer: No Typology Code available for payment source | Attending: Emergency Medicine | Admitting: Emergency Medicine

## 2020-01-13 DIAGNOSIS — R1031 Right lower quadrant pain: Secondary | ICD-10-CM | POA: Insufficient documentation

## 2020-01-13 DIAGNOSIS — R319 Hematuria, unspecified: Secondary | ICD-10-CM | POA: Diagnosis not present

## 2020-01-13 DIAGNOSIS — R109 Unspecified abdominal pain: Secondary | ICD-10-CM

## 2020-01-13 DIAGNOSIS — Z79899 Other long term (current) drug therapy: Secondary | ICD-10-CM | POA: Diagnosis not present

## 2020-01-13 NOTE — ED Triage Notes (Signed)
Pt reports R flank pain. She states that Dr. Junious Silk saw a kidney stone on an [place] years ago on the same side. She reports vomiting. Denies dysuria.

## 2020-01-13 NOTE — ED Notes (Signed)
Pt attempted for urine sample, but was unable at that time. Pt instructed to let us know when she is able.

## 2020-01-13 NOTE — ED Notes (Signed)
Patient says she is unable to urinate at this time

## 2020-01-14 ENCOUNTER — Emergency Department (HOSPITAL_COMMUNITY): Payer: No Typology Code available for payment source

## 2020-01-14 LAB — URINALYSIS, ROUTINE W REFLEX MICROSCOPIC
Bilirubin Urine: NEGATIVE
Glucose, UA: NEGATIVE mg/dL
Ketones, ur: 20 mg/dL — AB
Nitrite: NEGATIVE
Protein, ur: NEGATIVE mg/dL
RBC / HPF: >50 RBC/hpf — ABNORMAL HIGH (ref 0–5)
Specific Gravity, Urine: 1.017 (ref 1.005–1.030)
pH: 5 (ref 5.0–8.0)

## 2020-01-14 LAB — I-STAT CHEM 8, ED
BUN: 17 mg/dL (ref 8–23)
Calcium, Ion: 1.19 mmol/L (ref 1.15–1.40)
Chloride: 102 mmol/L (ref 98–111)
Creatinine, Ser: 0.9 mg/dL (ref 0.44–1.00)
Glucose, Bld: 146 mg/dL — ABNORMAL HIGH (ref 70–99)
HCT: 40 % (ref 36.0–46.0)
Hemoglobin: 13.6 g/dL (ref 12.0–15.0)
Potassium: 3.5 mmol/L (ref 3.5–5.1)
Sodium: 141 mmol/L (ref 135–145)
TCO2: 28 mmol/L (ref 22–32)

## 2020-01-14 LAB — CBC WITH DIFFERENTIAL/PLATELET
Abs Immature Granulocytes: 0.21 10*3/uL — ABNORMAL HIGH (ref 0.00–0.07)
Basophils Absolute: 0 10*3/uL (ref 0.0–0.1)
Basophils Relative: 0 %
Eosinophils Absolute: 0 10*3/uL (ref 0.0–0.5)
Eosinophils Relative: 0 %
HCT: 41.1 % (ref 36.0–46.0)
Hemoglobin: 13.3 g/dL (ref 12.0–15.0)
Immature Granulocytes: 2 %
Lymphocytes Relative: 4 %
Lymphs Abs: 0.6 10*3/uL — ABNORMAL LOW (ref 0.7–4.0)
MCH: 31 pg (ref 26.0–34.0)
MCHC: 32.4 g/dL (ref 30.0–36.0)
MCV: 95.8 fL (ref 80.0–100.0)
Monocytes Absolute: 0.3 10*3/uL (ref 0.1–1.0)
Monocytes Relative: 3 %
Neutro Abs: 12.6 10*3/uL — ABNORMAL HIGH (ref 1.7–7.7)
Neutrophils Relative %: 91 %
Platelets: 220 10*3/uL (ref 150–400)
RBC: 4.29 MIL/uL (ref 3.87–5.11)
RDW: 13 % (ref 11.5–15.5)
WBC: 13.8 10*3/uL — ABNORMAL HIGH (ref 4.0–10.5)
nRBC: 0 % (ref 0.0–0.2)

## 2020-01-14 MED ORDER — FENTANYL CITRATE (PF) 100 MCG/2ML IJ SOLN: 50.0000 ug | Freq: Once | INTRAMUSCULAR | Status: DC

## 2020-01-14 MED ORDER — ONDANSETRON HCL 4 MG/2ML IJ SOLN: 4.0000 mg | Freq: Once | INTRAMUSCULAR | Status: DC

## 2020-01-14 MED ORDER — KETOROLAC TROMETHAMINE 30 MG/ML IJ SOLN: 30.0000 mg | Freq: Once | INTRAMUSCULAR | Status: DC

## 2020-01-14 MED ORDER — DICLOFENAC SODIUM ER 100 MG PO TB24: 100.0000 mg | ORAL_TABLET | Freq: Every day | ORAL | 0 refills | Status: DC

## 2020-01-14 NOTE — ED Provider Notes (Signed)
[facility] DEPT Provider Note   CSN: [id number] Arrival date & time: 01/13/20  2223     History Chief Complaint  Patient presents with  . Flank Pain    R    [name] is a 64 y.o. female.  The history is provided by the patient.  Flank Pain This is a recurrent problem. The current episode started 3 to 5 hours ago. The problem occurs constantly. The problem has been resolved. Pertinent negatives include no chest pain, no abdominal pain, no headaches and no shortness of breath. Nothing aggravates the symptoms. Nothing relieves the symptoms. She has tried nothing for the symptoms. The treatment provided significant relief.       Past Medical History:  Diagnosis Date  . ASCUS with positive high risk HPV 09/2004  . CIN I (cervical intraepithelial neoplasia I) 2005  . Condyloma   . Fibroids   . GERD (gastroesophageal reflux disease)     Patient Active Problem List   Diagnosis Date Noted  . Lightheadedness 12/27/2019  . Renal stone 07/19/2019  . Thyroid nodule 07/19/2019  . Lower back pain 09/28/2013  . Preventative health care 09/28/2013  . Vaginitis and vulvovaginitis 09/28/2013  . Urinary urgency 09/28/2013  . INCISIONAL HERNIA 08/28/2010  . GERD 08/05/2007    Past Surgical History:  Procedure Laterality Date  . CERVICAL BIOPSY  W/ LOOP ELECTRODE EXCISION     CIN 3-Clear margins   . MYOMECTOMY  1991  . TOTAL ABDOMINAL HYSTERECTOMY  08/27/2008   Aborted R TLH-TAH ov retained; fibroids recurrent CIN, path-fibroids, CIN I     OB History    Gravida  0   Para  0   Term  0   Preterm  0   AB  0   Living  0     SAB  0   TAB  0   Ectopic  0   Multiple  0   Live Births              Family History  Problem Relation Age of Onset  . Congestive Heart Failure Father   . Hypertension Mother   . Breast cancer Sister 16       Dx'd 03/2016  . Breast cancer Maternal Aunt   . Breast cancer Paternal Aunt   . Breast  cancer Paternal Grandmother   . Breast cancer Maternal Aunt   . Breast cancer Maternal Aunt     Social History   Tobacco Use  . Smoking status: Never Smoker  . Smokeless tobacco: Never Used  Substance Use Topics  . Alcohol use: Yes    Alcohol/week: 1.0 standard drinks    Types: 1 Standard drinks or equivalent per week    Comment: rarely drinks  . Drug use: No    Home Medications Prior to Admission medications   Medication Sig Start Date End Date Taking? Authorizing Provider  chlorhexidine (PERIDEX) 0.12 % solution RINSE 2 TIMES PER DAY (IN MORNING AND BEFORE BEDTIME) FOR 30 SECONDS 04/20/19   [provider]  Multiple Vitamin (MULTIVITAMIN) tablet Take 1 tablet by mouth daily.    [provider]  Omega-3 Fatty Acids (FISH OIL PO) Take 1 capsule by mouth as needed.     [provider]  Vitamin D, Ergocalciferol, (DRISDOL) 1.25 MG (50000 UT) CAPS capsule Take 1 capsule (50,000 Units total) by mouth every 7 (seven) days. 07/19/19   Biagio Borg, MD    Allergies    Patient has  no known allergies.  Review of Systems   Review of Systems  Constitutional: Negative for fever.  HENT: Negative for congestion.   Eyes: Negative for visual disturbance.  Respiratory: Negative for shortness of breath.   Cardiovascular: Negative for chest pain.  Gastrointestinal: Positive for vomiting. Negative for abdominal pain.  Genitourinary: Positive for flank pain.  Musculoskeletal: Negative for arthralgias.  Skin: Negative for color change.  Neurological: Negative for headaches.  Psychiatric/Behavioral: Negative for agitation.  All other systems reviewed and are negative.   Physical Exam Updated Vital Signs BP 115/77 (BP Location: Left Arm)   Pulse 61   Temp 97.9 F (36.6 C) (Oral)   Resp 18   Ht 5\' 7"  (1.702 m)   Wt 65.8 kg   LMP 11/16/2007 (Within Months)   SpO2 100%   BMI 22.71 kg/m   Physical Exam Vitals and nursing note reviewed.  Constitutional:       General: She is not in acute distress.    Appearance: She is normal weight.  HENT:     Head: Normocephalic and atraumatic.     Nose: Nose normal.  Eyes:     Conjunctiva/sclera: Conjunctivae normal.     Pupils: Pupils are equal, round, and reactive to light.  Cardiovascular:     Rate and Rhythm: Normal rate and regular rhythm.     Pulses: Normal pulses.     Heart sounds: Normal heart sounds.  Pulmonary:     Effort: Pulmonary effort is normal.     Breath sounds: Normal breath sounds.  Abdominal:     General: Abdomen is flat. Bowel sounds are normal.     Tenderness: There is no abdominal tenderness. There is no guarding.  Musculoskeletal:        General: Normal range of motion.     Cervical back: Normal range of motion and neck supple.  Skin:    General: Skin is warm and dry.     Capillary Refill: Capillary refill takes less than 2 seconds.  Neurological:     General: No focal deficit present.     Mental Status: She is alert and oriented to person, place, and time.     Deep Tendon Reflexes: Reflexes normal.  Psychiatric:        Mood and Affect: Mood normal.        Behavior: Behavior normal.     ED Results / Procedures / Treatments   Labs (all labs ordered are listed, but only abnormal results are displayed) Results for orders placed or performed during the hospital encounter of 01/13/20  Urinalysis, Routine w reflex microscopic- may I&O cath if menses  Result Value Ref Range   Color, Urine YELLOW YELLOW   APPearance CLEAR CLEAR   Specific Gravity, Urine 1.017 1.005 - 1.030   pH 5.0 5.0 - 8.0   Glucose, UA NEGATIVE NEGATIVE mg/dL   Hgb urine dipstick LARGE (A) NEGATIVE   Bilirubin Urine NEGATIVE NEGATIVE   Ketones, ur 20 (A) NEGATIVE mg/dL   Protein, ur NEGATIVE NEGATIVE mg/dL   Nitrite NEGATIVE NEGATIVE   Leukocytes,Ua SMALL (A) NEGATIVE   RBC / HPF >50 (H) 0 - 5 RBC/hpf   WBC, UA 11-20 0 - 5 WBC/hpf   Bacteria, UA RARE (A) NONE SEEN   Squamous Epithelial / LPF 0-5  0 - 5   Mucus PRESENT   CBC with Differential/Platelet  Result Value Ref Range   WBC 13.8 (H) 4.0 - 10.5 K/uL   RBC 4.29 3.87 - 5.11 MIL/uL  Hemoglobin 13.3 12.0 - 15.0 g/dL   HCT 41.1 36.0 - 46.0 %   MCV 95.8 80.0 - 100.0 fL   MCH 31.0 26.0 - 34.0 pg   MCHC 32.4 30.0 - 36.0 g/dL   RDW 13.0 11.5 - 15.5 %   Platelets 220 150 - 400 K/uL   nRBC 0.0 0.0 - 0.2 %   Neutrophils Relative % 91 %   Neutro Abs 12.6 (H) 1.7 - 7.7 K/uL   Lymphocytes Relative 4 %   Lymphs Abs 0.6 (L) 0.7 - 4.0 K/uL   Monocytes Relative 3 %   Monocytes Absolute 0.3 0.1 - 1.0 K/uL   Eosinophils Relative 0 %   Eosinophils Absolute 0.0 0.0 - 0.5 K/uL   Basophils Relative 0 %   Basophils Absolute 0.0 0.0 - 0.1 K/uL   Immature Granulocytes 2 %   Abs Immature Granulocytes 0.21 (H) 0.00 - 0.07 K/uL  I-stat chem 8, ED (not at [facility] or [facility])  Result Value Ref Range   Sodium 141 135 - 145 mmol/L   Potassium 3.5 3.5 - 5.1 mmol/L   Chloride 102 98 - 111 mmol/L   BUN 17 8 - 23 mg/dL   Creatinine, Ser 0.90 0.44 - 1.00 mg/dL   Glucose, Bld 146 (H) 70 - 99 mg/dL   Calcium, Ion 1.19 1.15 - 1.40 mmol/L   TCO2 28 22 - 32 mmol/L   Hemoglobin 13.6 12.0 - 15.0 g/dL   HCT 40.0 36.0 - 46.0 %   CT Renal Stone Study  Result Date: 01/14/2020 CLINICAL DATA:  Right flank pain. EXAM: CT ABDOMEN AND PELVIS WITHOUT CONTRAST TECHNIQUE: Multidetector CT imaging of the abdomen and pelvis was performed following the standard protocol without IV contrast. COMPARISON:  CT 10/14/2010 and 09/20/2017. FINDINGS: Lower chest: Normal Hepatobiliary: Liver parenchyma is normal without contrast. No calcified gallstones. Pancreas: Normal Spleen: Normal Adrenals/Urinary Tract: Adrenal glands are normal. Kidneys are normal. No cyst, mass, stone or hydronephrosis. No stone along the course of either ureter. No stone in the bladder. Few small phleboliths in the pelvis. Stomach/Bowel: Normal.  No ileus or obstruction.  Normal appendix. Vascular/Lymphatic:  Normal Reproductive: Previous hysterectomy.  No pelvic mass. Other: No free fluid or air. Musculoskeletal: Ordinary mild lower lumbar degenerative changes. IMPRESSION: No acute or significant finding. No evidence of urinary tract stone disease. Normal appendix. No other finding to explain right flank pain. Electronically Signed   By: Nelson Chimes M.D.   On: 01/14/2020 01:52    Radiology CT Renal Stone Study  Result Date: 01/14/2020 CLINICAL DATA:  Right flank pain. EXAM: CT ABDOMEN AND PELVIS WITHOUT CONTRAST TECHNIQUE: Multidetector CT imaging of the abdomen and pelvis was performed following the standard protocol without IV contrast. COMPARISON:  CT 10/14/2010 and 09/20/2017. FINDINGS: Lower chest: Normal Hepatobiliary: Liver parenchyma is normal without contrast. No calcified gallstones. Pancreas: Normal Spleen: Normal Adrenals/Urinary Tract: Adrenal glands are normal. Kidneys are normal. No cyst, mass, stone or hydronephrosis. No stone along the course of either ureter. No stone in the bladder. Few small phleboliths in the pelvis. Stomach/Bowel: Normal.  No ileus or obstruction.  Normal appendix. Vascular/Lymphatic: Normal Reproductive: Previous hysterectomy.  No pelvic mass. Other: No free fluid or air. Musculoskeletal: Ordinary mild lower lumbar degenerative changes. IMPRESSION: No acute or significant finding. No evidence of urinary tract stone disease. Normal appendix. No other finding to explain right flank pain. Electronically Signed   By: Nelson Chimes M.D.   On: 01/14/2020 01:52    Procedures Procedures (  including critical care time)  Medications Ordered in ED Medications  ketorolac (TORADOL) 30 MG/ML injection 30 mg (30 mg Intravenous Refused 01/14/20 0039)  ondansetron (ZOFRAN) injection 4 mg (4 mg Intravenous Refused 01/14/20 0040)  fentaNYL (SUBLIMAZE) injection 50 mcg (50 mcg Intravenous Refused 01/14/20 0039)    ED Course  I have reviewed the triage vital signs and the nursing  notes.  Pertinent labs & imaging results that were available during my care of the patient were reviewed by me and considered in my medical decision making (see chart for details).  Patient refused meds. No pain at this time.  Urine is consistent with a stone but there is no stone on CT.  Patient may have passed it already without her knowledge.  Stable for discharge with urology follow up.    [name] was evaluated in Emergency Department on 01/14/2020 for the symptoms described in the history of present illness. She was evaluated in the context of the global COVID-19 pandemic, which necessitated consideration that the patient might be at risk for infection with the SARS-CoV-2 virus that causes COVID-19. Institutional protocols and algorithms that pertain to the evaluation of patients at risk for COVID-19 are in a state of rapid change based on information released by regulatory bodies including the CDC and federal and state organizations. These policies and algorithms were followed during the patient's care in the ED.  Final Clinical Impression(s) / ED Diagnoses  Return for weakness, numbness, changes in vision or speech, fevers >100.4 unrelieved by medication, shortness of breath, intractable vomiting, or diarrhea, abdominal pain, Inability to tolerate liquids or food, cough, altered mental status or any concerns. No signs of systemic illness or infection. The patient is nontoxic-appearing on exam and vital signs are within normal limits.   I have reviewed the triage vital signs and the nursing notes. Pertinent labs &imaging results that were available during my care of the patient were reviewed by me and considered in my medical decision making (see chart for details).  After history, exam, and medical workup I feel the patient has been appropriately medically screened and is safe for discharge home. Pertinent diagnoses were discussed with the patient. Patient was given return  precautions   Josuel Koeppen, MD 01/14/20 [id number]

## 2020-01-14 NOTE — ED Notes (Signed)
Patient refused IV. 

## 2020-01-31 ENCOUNTER — Other Ambulatory Visit: Payer: Self-pay

## 2020-01-31 ENCOUNTER — Ambulatory Visit
Admission: RE | Admit: 2020-01-31 | Discharge: 2020-01-31 | Disposition: A | Discharge status: Home / Self Care | Payer: No Typology Code available for payment source | Source: Ambulatory Visit | Attending: Obstetrics and Gynecology | Admitting: Obstetrics and Gynecology

## 2020-01-31 DIAGNOSIS — Z1231 Encounter for screening mammogram for malignant neoplasm of breast: Secondary | ICD-10-CM

## 2020-02-21 ENCOUNTER — Other Ambulatory Visit: Payer: Self-pay

## 2020-02-21 ENCOUNTER — Encounter: Payer: Self-pay | Admitting: Internal Medicine

## 2020-02-21 ENCOUNTER — Ambulatory Visit (INDEPENDENT_AMBULATORY_CARE_PROVIDER_SITE_OTHER): Payer: No Typology Code available for payment source | Admitting: Internal Medicine

## 2020-02-21 VITALS — BP 104/60 | HR 72 | Temp 97.9°F | Ht 67.0 in | Wt 141.4 lb

## 2020-02-21 DIAGNOSIS — M545 Low back pain, unspecified: Secondary | ICD-10-CM

## 2020-02-21 DIAGNOSIS — N200 Calculus of kidney: Secondary | ICD-10-CM

## 2020-02-21 DIAGNOSIS — K219 Gastro-esophageal reflux disease without esophagitis: Secondary | ICD-10-CM

## 2020-02-21 DIAGNOSIS — R300 Dysuria: Secondary | ICD-10-CM | POA: Diagnosis not present

## 2020-02-21 LAB — URINALYSIS, ROUTINE W REFLEX MICROSCOPIC
Bilirubin Urine: NEGATIVE
Ketones, ur: NEGATIVE
Nitrite: NEGATIVE
Specific Gravity, Urine: 1.01 (ref 1.000–1.030)
Total Protein, Urine: NEGATIVE
Urine Glucose: NEGATIVE
Urobilinogen, UA: 0.2 (ref 0.0–1.0)
pH: 6 (ref 5.0–8.0)

## 2020-02-21 LAB — POCT URINALYSIS DIPSTICK OB
Bilirubin, UA: NEGATIVE
Glucose, UA: NEGATIVE
Ketones, UA: NEGATIVE
Nitrite, UA: NEGATIVE
POC,PROTEIN,UA: NEGATIVE
Spec Grav, UA: 1.01 (ref 1.010–1.025)
Urobilinogen, UA: 0.2 E.U./dL
pH, UA: 6 (ref 5.0–8.0)

## 2020-02-21 MED ORDER — CIPROFLOXACIN HCL 500 MG PO TABS: 500.0000 mg | ORAL_TABLET | Freq: Two times a day (BID) | ORAL | 0 refills | Status: AC

## 2020-02-21 NOTE — Progress Notes (Signed)
Subjective:    Patient ID: [name], female    DOB: July 18, 1956, 64 y.o.   MRN: [id number]  HPI  Here with acute onset 4 days with ow abd pain, kind of better and worse, but today with mild to mod dysuria, cloudy urine, frequency, urgency, but no blod , n/v, but did have some mild low back pain a few days ago.  No high fever chills.  Has hx of renal stone, passed a few yrs ago, then again about 5 wks ago had to go to ED after syncope with vomiting due to renal colic.  Had blood in urine, but CT neg for stone.  Has seen Dr Junious Silk urology in past since first renal stone, seen yearly.  /Pt denies chest pain, increased sob or doe, wheezing, orthopnea, PND, increased LE swelling, palpitations, dizziness or syncope.Denies worsening reflux, abd pain, dysphagia, n/v, bowel change or blood. Pt continues to have recurring LBP without change in severity, bowel or bladder change, fever, wt loss,  worsening LE pain/numbness/weakness, gait change or falls. Past Medical History:  Diagnosis Date  . ASCUS with positive high risk HPV 09/2004  . CIN I (cervical intraepithelial neoplasia I) 2005  . Condyloma   . Fibroids   . GERD (gastroesophageal reflux disease)    Past Surgical History:  Procedure Laterality Date  . CERVICAL BIOPSY  W/ LOOP ELECTRODE EXCISION     CIN 3-Clear margins   . MYOMECTOMY  1991  . TOTAL ABDOMINAL HYSTERECTOMY  08/27/2008   Aborted R TLH-TAH ov retained; fibroids recurrent CIN, path-fibroids, CIN I    reports that she has never smoked. She has never used smokeless tobacco. She reports current alcohol use of about 1.0 standard drinks of alcohol per week. She reports that she does not use drugs. family history includes Breast cancer in her maternal aunt, maternal aunt, maternal aunt, paternal aunt, and paternal grandmother; Breast cancer (age of onset: 20) in her sister; Congestive Heart Failure in her father; Hypertension in her mother. No Known Allergies Current Outpatient  Medications on File Prior to Visit  Medication Sig Dispense Refill  . chlorhexidine (PERIDEX) 0.12 % solution RINSE 2 TIMES PER DAY (IN MORNING AND BEFORE BEDTIME) FOR 30 SECONDS    . Diclofenac Sodium CR 100 MG 24 hr tablet Take 1 tablet (100 mg total) by mouth daily. 7 tablet 0  . Multiple Vitamin (MULTIVITAMIN) tablet Take 1 tablet by mouth daily.    . Omega-3 Fatty Acids (FISH OIL PO) Take 1 capsule by mouth as needed.     . Vitamin D, Ergocalciferol, (DRISDOL) 1.25 MG (50000 UT) CAPS capsule Take 1 capsule (50,000 Units total) by mouth every 7 (seven) days. 12 capsule 0   No current facility-administered medications on file prior to visit.   Review of Systems All otherwise neg per pt     Objective:   Physical Exam BP 104/60   Pulse 72   Temp 97.9 F (36.6 C)   Ht 5\' 7"  (123XX123 m)   Wt 141 lb 6.4 oz (64.1 kg)   LMP 11/16/2007 (Within Months)   SpO2 99%   BMI 22.15 kg/m  VS noted,  Constitutional: Pt appears in NAD HENT: Head: NCAT.  Right Ear: External ear normal.  Left Ear: External ear normal.  Eyes: . Pupils are equal, round, and reactive to light. Conjunctivae and EOM are normal Nose: without d/c or deformity Neck: Neck supple. Gross normal ROM Cardiovascular: Normal rate and regular rhythm.   Pulmonary/Chest: Effort  normal and breath sounds without rales or wheezing.  Abd:  Soft,  ND, + BS, no organomegaly with low mid abd tender, no flank tender Neurological: Pt is alert. At baseline orientation, motor grossly intact Skin: Skin is warm. No rashes, other new lesions, no LE edema Psychiatric: Pt behavior is normal without agitation  All otherwise neg per pt  POC Urinalysis Dipstick OB Order: [id number] Status:  Final result Visible to patient:  No (scheduled for 02/21/2020 11:31 AM) Dx:  Dysuria  Ref Range & Units 10:29  (02/21/20) 1 mo ago  (01/14/20) 7 mo ago  (07/19/19) 2 yr ago  (08/04/17) 2 yr ago  (08/01/17)  Color, UA     yellow  yellow   Clarity, UA     clear   cloudy   Glucose, UA Negative Negative  NEGATIVE R      Bilirubin, UA  negative    n  neg   Ketones, UA  negative    n  neg   Spec Grav, UA 1.010 - 1.025 1.010         Blood, UA  2+    moderate  +++   pH, UA 5.0 - 8.0 6.0    5.0  6.5   POC,PROTEIN,UA Negative, Trace, Small (1+), Moderate (2+), Large (3+), 4+ Negative  NEGATIVE R      Urobilinogen, UA 0.2 or 1.0 E.U./dL 0.2   0.2 R  0.2  negativeAbnormal    Nitrite, UA  negative    n  neg   Leukocytes, UA Negative Small (1+)Abnormal     Negative  Large (3+)Abnormal    Appearance   CLEAR R  CLEAR R     Odor        Resulting Agency   [facility]             Assessment & Plan:

## 2020-02-21 NOTE — Assessment & Plan Note (Addendum)
Likely UTI by exam and Udip - for empiric antibiotic, and f/u urine cx  I spent 31 minutes in preparing to see the patient by review of recent labs, imaging and procedures, obtaining and reviewing separately obtained history, communicating with the patient and family or caregiver, ordering medications, tests or procedures, and documenting clinical information in the EHR including the differential Dx, treatment, and any further evaluation and other management of dysuria, gerd, lbp, renal stone

## 2020-02-21 NOTE — Assessment & Plan Note (Signed)
stable overall by history and exam, recent data reviewed with pt, and pt to continue medical treatment as before,  to f/u any worsening symptoms or concerns  

## 2020-02-21 NOTE — Assessment & Plan Note (Signed)
Recent passed, doubt current symptoms stone related, cont f/u with urology

## 2020-02-21 NOTE — Patient Instructions (Signed)
Your specimen will be sent for culture  Please take all new medication as prescribed - the pill antibiotic (cipro)  Please continue all other medications as before, and refills have been done if requested.  Please have the pharmacy call with any other refills you may need.  Please continue your efforts at being more active, low cholesterol diet, and weight control.  Please keep your appointments with your specialists as you may have planned

## 2020-02-23 LAB — URINE CULTURE

## 2020-04-09 ENCOUNTER — Other Ambulatory Visit: Payer: Self-pay | Admitting: Endocrinology

## 2020-04-09 DIAGNOSIS — E049 Nontoxic goiter, unspecified: Secondary | ICD-10-CM

## 2020-05-06 ENCOUNTER — Ambulatory Visit
Admission: RE | Admit: 2020-05-06 | Discharge: 2020-05-06 | Disposition: A | Discharge status: Home / Self Care | Payer: No Typology Code available for payment source | Source: Ambulatory Visit | Attending: Endocrinology | Admitting: Endocrinology

## 2020-05-06 DIAGNOSIS — E049 Nontoxic goiter, unspecified: Secondary | ICD-10-CM

## 2020-05-13 NOTE — Progress Notes (Signed)
64 y.o. G37P0000 Married Caucasian female here for annual exam.     CT scan on 01/14/20 for right flank pain.  No stones seen but she states she passed one while she was in the ER.  No pelvic mass seen.  Retiring in September.   Completed her Covid vaccine.   PCP:   Cathlean Cower, MD  Patient's last menstrual period was 11/16/2007 (within months).           Sexually active: Yes.    The current method of family planning is status post hysterectomy.   Final pathology is CIN I of cervix. Exercising: Yes.    walking & home workout Smoker:  no  Health Maintenance: Pap: 05-02-19 Neg:Neg HR HPV History of abnormal Pap:  Yes,02-12-15 LGSIL:Neg HR HPV. pap 01-30-14 LGSIL;neg HR HPV;colposcopy no acetowhite lesions and no biopsies. History of LEEP in 2009 for CIN III--margins clear. MMG: 01-31-20 3D/Neg/density C/BiRads1 Colonoscopy: 01-19-19 diverticulosis;next due 10 years BMD: 09-05-19  Result :Normal TDaP: 05-02-19 HIV:07-19-19 NR Hep C: 03-26-16 Neg Screening Labs:   PCP.    reports that she has never smoked. She has never used smokeless tobacco. She reports previous alcohol use. She reports that she does not use drugs.  Past Medical History:  Diagnosis Date  . ASCUS with positive high risk HPV 09/2004  . CIN I (cervical intraepithelial neoplasia I) 2005  . Condyloma   . Fibroids   . GERD (gastroesophageal reflux disease)   . Kidney stone     Past Surgical History:  Procedure Laterality Date  . CERVICAL BIOPSY  W/ LOOP ELECTRODE EXCISION     CIN 3-Clear margins   . MYOMECTOMY  1991  . TOTAL ABDOMINAL HYSTERECTOMY  08/27/2008   Aborted R TLH-TAH ov retained; fibroids recurrent CIN, path-fibroids, CIN I    Current Outpatient Medications  Medication Sig Dispense Refill  . chlorhexidine (PERIDEX) 0.12 % solution RINSE 2 TIMES PER DAY (IN MORNING AND BEFORE BEDTIME) FOR 30 SECONDS    . Multiple Vitamin (MULTIVITAMIN) tablet Take 1 tablet by mouth daily.    Marland Kitchen VITAMIN D PO Take by mouth.      No current facility-administered medications for this visit.    Family History  Problem Relation Age of Onset  . Congestive Heart Failure Father   . Hypertension Mother   . Breast cancer Sister 13       Dx'd 03/2016  . Breast cancer Maternal Aunt   . Breast cancer Paternal Aunt   . Breast cancer Paternal Grandmother   . Breast cancer Maternal Aunt   . Breast cancer Maternal Aunt     Review of Systems  Constitutional: Negative.   HENT: Negative.   Eyes: Negative.   Respiratory: Negative.   Cardiovascular: Negative.   Gastrointestinal: Negative.   Endocrine: Negative.   Genitourinary: Negative.   Musculoskeletal: Negative.   Skin: Negative.   Allergic/Immunologic: Negative.   Neurological: Negative.   Hematological: Negative.   Psychiatric/Behavioral: Negative.     Exam:   BP 108/64   Pulse 68   Resp 16   Ht 5' 6.75" (1.695 m)   Wt 139 lb (63 kg)   LMP 11/16/2007 (Within Months)   BMI 21.93 kg/m     General appearance: alert, cooperative and appears stated age Head: normocephalic, without obvious abnormality, atraumatic Neck: no adenopathy, supple, symmetrical, trachea midline and thyroid enlarged, nontender. Lungs: clear to auscultation bilaterally Breasts: normal appearance, no masses or tenderness, No nipple retraction or dimpling, No nipple discharge or bleeding,  No axillary adenopathy Heart: regular rate and rhythm Abdomen: soft, non-tender; no masses, no organomegaly Extremities: extremities normal, atraumatic, no cyanosis or edema Skin: skin color, texture, turgor normal. No rashes or lesions Lymph nodes: cervical, supraclavicular, and axillary nodes normal. Neurologic: grossly normal  Pelvic: External genitalia:  Pigment of the perineum near vaginal opening, spreading pattern 7 x 3 mm.              No abnormal inguinal nodes palpated.              Urethra:  normal appearing urethra with no masses, tenderness or lesions              Bartholins and  Skenes: normal                 Vagina: normal appearing vagina with normal color and discharge, no lesions              Cervix:  Absent.              Pap taken: Yes.   Bimanual Exam:  Uterus:  Absent.               Adnexa: no mass, fullness, tenderness              Rectal exam: Yes.  .  Confirms.              Anus:  normal sphincter tone, no lesions  Chaperone was present for exam.  Assessment:   Well woman visit with normal exam. Status post robotic TLH. final pathology - CIN I of cervix.  Ovaries retained. Hx VAIN I with normal colposcopy. Hx prior CIN III. Negative HR HPV status on last pap. Pigmentation of the perineum. FH breast cancer.Sister tested negative for genetic mutations. Off ERT. Enlarged thryroid.Status post thyroid biopsy.  Vaginal atrophy.   Plan: Mammogram screening discussed. Self breast awareness reviewed. Pap and HR HPV as above. Guidelines for Calcium, Vitamin D, regular exercise program including cardiovascular and weight bearing exercise. Return for vulvar biopsy.  Follow up annually and prn.   After visit summary provided.

## 2020-05-14 ENCOUNTER — Ambulatory Visit (INDEPENDENT_AMBULATORY_CARE_PROVIDER_SITE_OTHER): Payer: No Typology Code available for payment source | Admitting: Obstetrics and Gynecology

## 2020-05-14 ENCOUNTER — Other Ambulatory Visit: Payer: Self-pay

## 2020-05-14 ENCOUNTER — Encounter: Payer: Self-pay | Admitting: Obstetrics and Gynecology

## 2020-05-14 ENCOUNTER — Other Ambulatory Visit (HOSPITAL_COMMUNITY)
Admission: RE | Admit: 2020-05-14 | Discharge: 2020-05-14 | Disposition: A | Discharge status: Home / Self Care | Payer: No Typology Code available for payment source | Source: Ambulatory Visit | Attending: Obstetrics and Gynecology | Admitting: Obstetrics and Gynecology

## 2020-05-14 VITALS — BP 108/64 | HR 68 | Resp 16 | Ht 66.75 in | Wt 139.0 lb

## 2020-05-14 DIAGNOSIS — Z01419 Encounter for gynecological examination (general) (routine) without abnormal findings: Secondary | ICD-10-CM | POA: Insufficient documentation

## 2020-05-14 DIAGNOSIS — N9089 Other specified noninflammatory disorders of vulva and perineum: Secondary | ICD-10-CM

## 2020-05-14 NOTE — Patient Instructions (Signed)

## 2020-05-15 ENCOUNTER — Telehealth: Payer: Self-pay | Admitting: Obstetrics and Gynecology

## 2020-05-15 LAB — CYTOLOGY - PAP
Comment: NEGATIVE
Diagnosis: NEGATIVE
High risk HPV: NEGATIVE

## 2020-05-15 NOTE — Telephone Encounter (Signed)
Call placed to convey benefits. Spoke with the patient and conveyed the benefits. Patient understands/agreeable with the benefits. Patient is aware of the cancellation policy. Appointment scheduled 06/02/20@11 :30am with Dr. Quincy Simmonds.

## 2020-05-28 NOTE — Progress Notes (Addendum)
GYNECOLOGY  VISIT   HPI: 64 y.o.   Married  Caucasian  female   G0P0000 with Patient's last menstrual period was 11/16/2007 (within months).   here for vulvar biopsy.    GYNECOLOGIC HISTORY: Patient's last menstrual period was 11/16/2007 (within months). Contraception:  Hyst Menopausal hormone therapy: None Last mammogram:  01-31-20 3D/Neg/density C/BiRads1 Last pap smear: 05-14-20 Neg:Neg HR HPV,  05-02-19 Neg:Neg HR HPV        OB History    Gravida  0   Para  0   Term  0   Preterm  0   AB  0   Living  0     SAB  0   TAB  0   Ectopic  0   Multiple  0   Live Births                 Patient Active Problem List   Diagnosis Date Noted  . Dysuria 02/21/2020  . Lightheadedness 12/27/2019  . Renal stone 07/19/2019  . Thyroid nodule 07/19/2019  . Lower back pain 09/28/2013  . Preventative health care 09/28/2013  . Vaginitis and vulvovaginitis 09/28/2013  . Urinary urgency 09/28/2013  . INCISIONAL HERNIA 08/28/2010  . GERD 08/05/2007    Past Medical History:  Diagnosis Date  . ASCUS with positive high risk HPV 09/2004  . CIN I (cervical intraepithelial neoplasia I) 2005  . Condyloma   . Fibroids   . GERD (gastroesophageal reflux disease)   . Kidney stone     Past Surgical History:  Procedure Laterality Date  . CERVICAL BIOPSY  W/ LOOP ELECTRODE EXCISION     CIN 3-Clear margins   . MYOMECTOMY  1991  . TOTAL ABDOMINAL HYSTERECTOMY  08/27/2008   Aborted R TLH-TAH ov retained; fibroids recurrent CIN, path-fibroids, CIN I    Current Outpatient Medications  Medication Sig Dispense Refill  . chlorhexidine (PERIDEX) 0.12 % solution RINSE 2 TIMES PER DAY (IN MORNING AND BEFORE BEDTIME) FOR 30 SECONDS    . Multiple Vitamin (MULTIVITAMIN) tablet Take 1 tablet by mouth daily.    Marland Kitchen VITAMIN D PO Take by mouth.     No current facility-administered medications for this visit.     ALLERGIES: Patient has no known allergies.  Family History  Problem Relation  Age of Onset  . Congestive Heart Failure Father   . Hypertension Mother   . Breast cancer Sister 92       Dx'd 03/2016  . Breast cancer Maternal Aunt   . Breast cancer Paternal Aunt   . Breast cancer Paternal Grandmother   . Breast cancer Maternal Aunt   . Breast cancer Maternal Aunt     Social History   Socioeconomic History  . Marital status: Married    Spouse name: Not on file  . Number of children: 0  . Years of education: Not on file  . Highest education level: Not on file  Occupational History  . Occupation: SOFTWARE SUPPORT  . Occupation: SOFTWARE SUPPORT    Employer: [employer]  Tobacco Use  . Smoking status: Never Smoker  . Smokeless tobacco: Never Used  Vaping Use  . Vaping Use: Never used  Substance and Sexual Activity  . Alcohol use: Not Currently  . Drug use: No  . Sexual activity: Yes    Partners: Male    Birth control/protection: Surgical    Comment: Hysterectomy  Other Topics Concern  . Not on file  Social History Narrative   No siblings with  heart problems. Works in English as a second language teacher.   Social Determinants of Health   Financial Resource Strain:   . Difficulty of Paying Living Expenses:   Food Insecurity:   . Worried About Charity fundraiser in the Last Year:   . Arboriculturist in the Last Year:   Transportation Needs:   . Film/video editor (Medical):   Marland Kitchen Lack of Transportation (Non-Medical):   Physical Activity:   . Days of Exercise per Week:   . Minutes of Exercise per Session:   Stress:   . Feeling of Stress :   Social Connections:   . Frequency of Communication with Friends and Family:   . Frequency of Social Gatherings with Friends and Family:   . Attends Religious Services:   . Active Member of Clubs or Organizations:   . Attends Archivist Meetings:   Marland Kitchen Marital Status:   Intimate Partner Violence:   . Fear of Current or Ex-Partner:   . Emotionally Abused:   Marland Kitchen Physically Abused:   . Sexually Abused:      Review of Systems  All other systems reviewed and are negative.   PHYSICAL EXAMINATION:    BP 102/60   Pulse 60   Ht 5' 6.5" (1.689 m)   Wt 140 lb (63.5 kg)   LMP 11/16/2007 (Within Months)   BMI 22.26 kg/m     General appearance: alert, cooperative and appears stated age  Pelvic: External genitalia:  4 x 7 mm spreading dark brown pigmented area of the perineum                Procedure Vulvar biopsy.  Consent for procedure.  Sterile prep with betadine.  Local 1% lidocaine, lot [id number], exp 10/15/20. 3 mm punch biopsy done.  Tissue to pathology. Single suture of 3/0 Vicryl.  Minimal EBL.  No complications.   Chaperone was present for exam.  ASSESSMENT  Pigmented skin of vulva.   PLAN  FU biopsy.  Instructions and precautions given.

## 2020-06-02 ENCOUNTER — Other Ambulatory Visit: Payer: Self-pay

## 2020-06-02 ENCOUNTER — Ambulatory Visit (INDEPENDENT_AMBULATORY_CARE_PROVIDER_SITE_OTHER): Payer: No Typology Code available for payment source | Admitting: Obstetrics and Gynecology

## 2020-06-02 ENCOUNTER — Other Ambulatory Visit (HOSPITAL_COMMUNITY)
Admission: RE | Admit: 2020-06-02 | Discharge: 2020-06-02 | Disposition: A | Discharge status: Home / Self Care | Payer: No Typology Code available for payment source | Source: Ambulatory Visit | Attending: Obstetrics and Gynecology | Admitting: Obstetrics and Gynecology

## 2020-06-02 ENCOUNTER — Encounter: Payer: Self-pay | Admitting: Obstetrics and Gynecology

## 2020-06-02 DIAGNOSIS — N9089 Other specified noninflammatory disorders of vulva and perineum: Secondary | ICD-10-CM | POA: Diagnosis present

## 2020-06-02 NOTE — Patient Instructions (Signed)
Vulva Biopsy, Care After This sheet gives you information about how to care for yourself after your procedure. Your health care provider may also give you more specific instructions. If you have problems or questions, contact your health care provider. What can I expect after the procedure? After the procedure, it is common to have:  Slight bleeding from the biopsy site.  Discomfort at the biopsy site. Follow these instructions at home: Biopsy site care   Follow instructions from your health care provider about how to take care of your biopsy site. Make sure you: ? Clean the area using water and mild soap twice a day or as told by your health care provider. Gently pat the area dry. ? If you were prescribed an antibiotic ointment, apply it as told by your health care provider. Do not stop using the antibiotic even if your condition improves. ? Take a warm water bath (sitz bath) as needed to help with pain and discomfort. A sitz bath is taken while you are sitting down. The water should only come up to your hips and should cover your buttocks. ? Leave stitches (sutures), skin glue, or adhesive strips in place. These skin closures may need to stay in place for 2 weeks or longer. If adhesive strip edges start to loosen and curl up, you may trim the loose edges. Do not remove adhesive strips completely unless your health care provider tells you to do that.  Check your biopsy site every day for signs of infection. Check for: ? More redness, swelling, or pain. ? More fluid or blood. ? Warmth. ? Pus or a bad smell.  Do not rub the biopsy area after urinating. Gently pat the area dry or use a bottle filled with warm water (peri-bottle) to clean the area. Gently wipe from front to back. Lifestyle  Wear loose, cotton underwear. Do not wear tight pants.  Do not use a tampon, douche, or put anything inside your vagina for at least 1 week or until your health care provider approves.  Do not have sex  for at least 1 week or until your health care provider approves.  Do not exercise, such as running or biking, until your health care provider approves.  Do not swim or use a hot tub until your health care provider approves. You may shower or take a sitz bath. General instructions  Take over-the-counter and prescription medicines only as told by your health care provider.  Use a sanitary napkin until the bleeding stops.  Keep all follow-up visits as told by your health care provider. This is important. Contact a health care provider if:  You have more redness, swelling, or pain around your biopsy site.  You have more fluid or blood coming from your biopsy site.  Your biopsy site feels warm to the touch.  Your pain is not controlled with medicine. Get help right away if you have:  Heavy bleeding from the vulva.  Pus or a bad smell coming from your biopsy site.  A fever.  Lower abdominal pain. Summary  After the procedure, it is common to have slight bleeding and discomfort at the biopsy site.  Follow instructions from your health care provider after your biopsy. Make sure you clean the area with water and mild soap. Pat the area dry.  Take sitz baths as needed to help with pain and discomfort. Leave any sutures in place.  Check your biopsy site for signs of infection, which may include more redness, swelling, pain, fluid,  or blood, or feeling warm to the touch.  Get help right away if you have heavy bleeding, a fever, pus or a bad smell, or pain in the lower abdomen. This information is not intended to replace advice given to you by your health care provider. Make sure you discuss any questions you have with your health care provider. Document Revised: 05/04/2018 Document Reviewed: 05/04/2018 Elsevier Patient Education  2020 [employer] Inc[employer]  

## 2020-06-06 LAB — SURGICAL PATHOLOGY

## 2020-06-09 ENCOUNTER — Telehealth: Payer: Self-pay

## 2020-06-09 NOTE — Telephone Encounter (Signed)
-----   Message from Nunzio Cobbs, MD sent at 06/07/2020  6:30 AM EDT ----- Please let the patient know that her vulvar biopsy showed no precancer or cancer cells.  There were findings of lichen simplex chronicus, which is thickening of the skin due to chronic irritation from itching/rubbing.   Please let me know if she is having irritation in the area.   I am highlighting this result so you know to contact the patient.

## 2020-06-09 NOTE — Telephone Encounter (Signed)
Encounter reviewed and closed.  

## 2020-06-09 NOTE — Telephone Encounter (Signed)
Spoke with patient and reviewed results of vulvar biopsy. Patient voiced understanding. She states only occasionally had irritation in this area prior to biopsy and this was usually after intercourse. She did state the stitch has not come out yet. Advised to call if doesn't come out in next few days and we can make appointment for Dr.Silva to remove.  Routed to Dr.Silva

## 2020-06-18 ENCOUNTER — Encounter: Payer: Self-pay | Admitting: Obstetrics and Gynecology

## 2020-06-18 ENCOUNTER — Other Ambulatory Visit: Payer: Self-pay

## 2020-06-18 ENCOUNTER — Ambulatory Visit (INDEPENDENT_AMBULATORY_CARE_PROVIDER_SITE_OTHER): Payer: No Typology Code available for payment source | Admitting: Obstetrics and Gynecology

## 2020-06-18 ENCOUNTER — Telehealth: Payer: Self-pay | Admitting: Obstetrics and Gynecology

## 2020-06-18 VITALS — BP 100/66 | HR 66 | Ht 66.5 in | Wt 140.0 lb

## 2020-06-18 DIAGNOSIS — L280 Lichen simplex chronicus: Secondary | ICD-10-CM

## 2020-06-18 DIAGNOSIS — Z4802 Encounter for removal of sutures: Secondary | ICD-10-CM

## 2020-06-18 MED ORDER — TRIAMCINOLONE ACETONIDE 0.025 % EX OINT: 1.0000 "application " | TOPICAL_OINTMENT | Freq: Two times a day (BID) | CUTANEOUS | 1 refills | Status: DC

## 2020-06-18 NOTE — Telephone Encounter (Signed)
Patient would like appointment to have stitch removed from biopsy site.

## 2020-06-18 NOTE — Progress Notes (Signed)
GYNECOLOGY  VISIT   HPI: 64 y.o.   Married  Caucasian  female   G0P0000 with Patient's last menstrual period was 11/16/2007 (within months).   here for suture removal.   Perineal biopsy showed lichen simplex chronicus overlying epidermoid cyst. Post inflammatory hyperpigmentation noted and no malignancy.  She does report some itching in the area and uses Vit E or Vagisil cream, which help.  GYNECOLOGIC HISTORY: Patient's last menstrual period was 11/16/2007 (within months). Contraception:  Hyst Menopausal hormone therapy:  none Last mammogram:  01-31-20 3D/Neg/density C/BiRads1 Last pap smear:  05-14-20 Neg:Neg HR HPV,  05-02-19 Neg:Neg HR HPV        OB History    Gravida  0   Para  0   Term  0   Preterm  0   AB  0   Living  0     SAB  0   TAB  0   Ectopic  0   Multiple  0   Live Births                 Patient Active Problem List   Diagnosis Date Noted  . Dysuria 02/21/2020  . Lightheadedness 12/27/2019  . Renal stone 07/19/2019  . Thyroid nodule 07/19/2019  . Lower back pain 09/28/2013  . Preventative health care 09/28/2013  . Vaginitis and vulvovaginitis 09/28/2013  . Urinary urgency 09/28/2013  . INCISIONAL HERNIA 08/28/2010  . GERD 08/05/2007    Past Medical History:  Diagnosis Date  . ASCUS with positive high risk HPV 09/2004  . CIN I (cervical intraepithelial neoplasia I) 2005  . Condyloma   . Fibroids   . GERD (gastroesophageal reflux disease)   . Kidney stone     Past Surgical History:  Procedure Laterality Date  . CERVICAL BIOPSY  W/ LOOP ELECTRODE EXCISION     CIN 3-Clear margins   . MYOMECTOMY  1991  . TOTAL ABDOMINAL HYSTERECTOMY  08/27/2008   Aborted R TLH-TAH ov retained; fibroids recurrent CIN, path-fibroids, CIN I    Current Outpatient Medications  Medication Sig Dispense Refill  . chlorhexidine (PERIDEX) 0.12 % solution RINSE 2 TIMES PER DAY (IN MORNING AND BEFORE BEDTIME) FOR 30 SECONDS    . Multiple Vitamin  (MULTIVITAMIN) tablet Take 1 tablet by mouth daily.    Marland Kitchen VITAMIN D PO Take by mouth.     No current facility-administered medications for this visit.     ALLERGIES: Patient has no known allergies.  Family History  Problem Relation Age of Onset  . Congestive Heart Failure Father   . Hypertension Mother   . Breast cancer Sister 42       Dx'd 03/2016  . Breast cancer Maternal Aunt   . Breast cancer Paternal Aunt   . Breast cancer Paternal Grandmother   . Breast cancer Maternal Aunt   . Breast cancer Maternal Aunt     Social History   Socioeconomic History  . Marital status: Married    Spouse name: Not on file  . Number of children: 0  . Years of education: Not on file  . Highest education level: Not on file  Occupational History  . Occupation: SOFTWARE SUPPORT  . Occupation: SOFTWARE SUPPORT    Employer: [employer]  Tobacco Use  . Smoking status: Never Smoker  . Smokeless tobacco: Never Used  Vaping Use  . Vaping Use: Never used  Substance and Sexual Activity  . Alcohol use: Not Currently  . Drug use: No  . Sexual  activity: Yes    Partners: Male    Birth control/protection: Surgical    Comment: Hysterectomy  Other Topics Concern  . Not on file  Social History Narrative   No siblings with heart problems. Works in English as a second language teacher.   Social Determinants of Health   Financial Resource Strain:   . Difficulty of Paying Living Expenses:   Food Insecurity:   . Worried About Charity fundraiser in the Last Year:   . Arboriculturist in the Last Year:   Transportation Needs:   . Film/video editor (Medical):   Marland Kitchen Lack of Transportation (Non-Medical):   Physical Activity:   . Days of Exercise per Week:   . Minutes of Exercise per Session:   Stress:   . Feeling of Stress :   Social Connections:   . Frequency of Communication with Friends and Family:   . Frequency of Social Gatherings with Friends and Family:   . Attends Religious Services:   . Active  Member of Clubs or Organizations:   . Attends Archivist Meetings:   Marland Kitchen Marital Status:   Intimate Partner Violence:   . Fear of Current or Ex-Partner:   . Emotionally Abused:   Marland Kitchen Physically Abused:   . Sexually Abused:     Review of Systems  All other systems reviewed and are negative.   PHYSICAL EXAMINATION:    LMP 11/16/2007 (Within Months)     General appearance: alert, cooperative and appears stated age   Pelvic: External genitalia:  Suture present in perineum at biopsy site.   Removed without difficulty.                 Chaperone was present for exam.  ASSESSMENT  Lichen simplex chronicus with hyperpigmentation of perineum. Encounter for removal of suture.  Healing well.  PLAN  Pathology report reviewed. Rx for Triamcinolone ointment.  FU prn.

## 2020-06-18 NOTE — Telephone Encounter (Signed)
Spoke with pt. Pt states calling back to have suture removed from vuvlar bx that was done on 06/02/20. Pt advised can have removed today. Scheduled as workin this morning at 1015 am. Pt agreeable and verbalized understanding.   Encounter closed.   Lowella Fairy, [place]     06/09/20 8:59 AM Note Spoke with patient and reviewed results of vulvar biopsy. Patient voiced understanding. She states only occasionally had irritation in this area prior to biopsy and this was usually after intercourse. She did state the stitch has not come out yet. Advised to call if doesn't come out in next few days and we can make appointment for Dr.Silva to remove.  Routed to Dr.Silva

## 2020-10-30 ENCOUNTER — Encounter: Payer: Self-pay | Admitting: Internal Medicine

## 2020-10-30 ENCOUNTER — Ambulatory Visit (INDEPENDENT_AMBULATORY_CARE_PROVIDER_SITE_OTHER): Payer: No Typology Code available for payment source | Admitting: Internal Medicine

## 2020-10-30 ENCOUNTER — Other Ambulatory Visit: Payer: Self-pay

## 2020-10-30 VITALS — BP 100/68 | HR 77 | Temp 97.7°F | Ht 66.5 in | Wt 137.0 lb

## 2020-10-30 DIAGNOSIS — E559 Vitamin D deficiency, unspecified: Secondary | ICD-10-CM

## 2020-10-30 DIAGNOSIS — Z0000 Encounter for general adult medical examination without abnormal findings: Secondary | ICD-10-CM

## 2020-10-30 LAB — CBC WITH DIFFERENTIAL/PLATELET
Basophils Absolute: 0.1 10*3/uL (ref 0.0–0.1)
Basophils Relative: 1.1 % (ref 0.0–3.0)
Eosinophils Absolute: 0.1 10*3/uL (ref 0.0–0.7)
Eosinophils Relative: 1.2 % (ref 0.0–5.0)
HCT: 41.9 % (ref 36.0–46.0)
Hemoglobin: 13.9 g/dL (ref 12.0–15.0)
Lymphocytes Relative: 23.5 % (ref 12.0–46.0)
Lymphs Abs: 1 10*3/uL (ref 0.7–4.0)
MCHC: 33.2 g/dL (ref 30.0–36.0)
MCV: 93.1 fl (ref 78.0–100.0)
Monocytes Absolute: 0.4 10*3/uL (ref 0.1–1.0)
Monocytes Relative: 9.9 % (ref 3.0–12.0)
Neutro Abs: 2.9 10*3/uL (ref 1.4–7.7)
Neutrophils Relative %: 64.3 % (ref 43.0–77.0)
Platelets: 231 10*3/uL (ref 150.0–400.0)
RBC: 4.5 Mil/uL (ref 3.87–5.11)
RDW: 13.4 % (ref 11.5–15.5)
WBC: 4.4 10*3/uL (ref 4.0–10.5)

## 2020-10-30 LAB — LIPID PANEL
Cholesterol: 188 mg/dL (ref 0–200)
HDL: 78.2 mg/dL (ref >39.00)
LDL Cholesterol: 95 mg/dL (ref 0–99)
NonHDL: 109.74
Total CHOL/HDL Ratio: 2
Triglycerides: 74 mg/dL (ref 0.0–149.0)
VLDL: 14.8 mg/dL (ref 0.0–40.0)

## 2020-10-30 LAB — URINALYSIS, ROUTINE W REFLEX MICROSCOPIC
Bilirubin Urine: NEGATIVE
Ketones, ur: NEGATIVE
Leukocytes,Ua: NEGATIVE
Nitrite: NEGATIVE
Specific Gravity, Urine: 1.02 (ref 1.000–1.030)
Total Protein, Urine: NEGATIVE
Urine Glucose: NEGATIVE
Urobilinogen, UA: 0.2 (ref 0.0–1.0)
pH: 8 (ref 5.0–8.0)

## 2020-10-30 LAB — BASIC METABOLIC PANEL
BUN: 15 mg/dL (ref 6–23)
CO2: 29 mEq/L (ref 19–32)
Calcium: 9.2 mg/dL (ref 8.4–10.5)
Chloride: 104 mEq/L (ref 96–112)
Creatinine, Ser: 0.84 mg/dL (ref 0.40–1.20)
GFR: 73.97 mL/min (ref >60.00)
Glucose, Bld: 78 mg/dL (ref 70–99)
Potassium: 4 mEq/L (ref 3.5–5.1)
Sodium: 139 mEq/L (ref 135–145)

## 2020-10-30 LAB — HEPATIC FUNCTION PANEL
ALT: 13 U/L (ref 0–35)
AST: 22 U/L (ref 0–37)
Albumin: 4.2 g/dL (ref 3.5–5.2)
Alkaline Phosphatase: 74 U/L (ref 39–117)
Bilirubin, Direct: 0.2 mg/dL (ref 0.0–0.3)
Total Bilirubin: 0.9 mg/dL (ref 0.2–1.2)
Total Protein: 7.2 g/dL (ref 6.0–8.3)

## 2020-10-30 LAB — TSH: TSH: 2.72 u[IU]/mL (ref 0.35–4.50)

## 2020-10-30 LAB — VITAMIN D 25 HYDROXY (VIT D DEFICIENCY, FRACTURES): VITD: 35.2 ng/mL (ref 30.00–100.00)

## 2020-10-30 NOTE — Patient Instructions (Signed)

## 2020-10-30 NOTE — Progress Notes (Signed)
Subjective:    Patient ID: [name], female    DOB: 1956/10/26, 64 y.o.   MRN: [id number]  HPI  Here for wellness and f/u;  Overall doing ok;  Pt denies Chest pain, worsening SOB, DOE, wheezing, orthopnea, PND, worsening LE edema, palpitations, dizziness or syncope.  Pt denies neurological change such as new headache, facial or extremity weakness.  Pt denies polydipsia, polyuria, or low sugar symptoms. Pt states overall good compliance with treatment and medications, good tolerability, and has been trying to follow appropriate diet.  Pt denies worsening depressive symptoms, suicidal ideation or panic. No fever, night sweats, wt loss, loss of appetite, or other constitutional symptoms.  Pt states good ability with ADL's, has low fall risk, home safety reviewed and adequate, no other significant changes in hearing or vision, and only occasionally active with exercise. Also 1 mo ago with episoe fatigue, diarrhea, HA without pain or fever or n/v.  Feels resolved now, wondering about covid.  Considering the booster soon.   Past Medical History:  Diagnosis Date  . ASCUS with positive high risk HPV 09/2004  . CIN I (cervical intraepithelial neoplasia I) 2005  . Condyloma   . Fibroids   . GERD (gastroesophageal reflux disease)   . Kidney stone    Past Surgical History:  Procedure Laterality Date  . CERVICAL BIOPSY  W/ LOOP ELECTRODE EXCISION     CIN 3-Clear margins   . MYOMECTOMY  1991  . TOTAL ABDOMINAL HYSTERECTOMY  08/27/2008   Aborted R TLH-TAH ov retained; fibroids recurrent CIN, path-fibroids, CIN I    reports that she has never smoked. She has never used smokeless tobacco. She reports previous alcohol use. She reports that she does not use drugs. family history includes Breast cancer in her maternal aunt, maternal aunt, maternal aunt, paternal aunt, and paternal grandmother; Breast cancer (age of onset: 2) in her sister; Congestive Heart Failure in her father; Hypertension in her  mother. No Known Allergies Current Outpatient Medications on File Prior to Visit  Medication Sig Dispense Refill  . chlorhexidine (PERIDEX) 0.12 % solution RINSE 2 TIMES PER DAY (IN MORNING AND BEFORE BEDTIME) FOR 30 SECONDS    . Multiple Vitamin (MULTIVITAMIN) tablet Take 1 tablet by mouth daily.    Marland Kitchen triamcinolone (KENALOG) 0.025 % ointment Apply 1 application topically 2 (two) times daily. Uses as needed. 30 g 1  . VITAMIN D PO Take by mouth.     No current facility-administered medications on file prior to visit.   Review of Systems All otherwise neg per pt     Objective:   Physical Exam BP 100/68   Pulse 77   Temp 97.7 F (36.5 C) (Oral)   Ht 5' 6.5" (1.689 m)   Wt 137 lb (62.1 kg)   LMP 11/16/2007 (Within Months)   SpO2 99%   BMI 21.78 kg/m  VS noted,  Constitutional: Pt appears in NAD HENT: Head: NCAT.  Right Ear: External ear normal.  Left Ear: External ear normal.  Eyes: . Pupils are equal, round, and reactive to light. Conjunctivae and EOM are normal Nose: without d/c or deformity Neck: Neck supple. Gross normal ROM Cardiovascular: Normal rate and regular rhythm.   Pulmonary/Chest: Effort normal and breath sounds without rales or wheezing.  Abd:  Soft, NT, ND, + BS, no organomegaly Neurological: Pt is alert. At baseline orientation, motor grossly intact Skin: Skin is warm. No rashes, other new lesions, no LE edema Psychiatric: Pt behavior is normal without agitation  All otherwise neg per pt Lab Results  Component Value Date   WBC 4.4 10/30/2020   HGB 13.9 10/30/2020   HCT 41.9 10/30/2020   PLT 231.0 10/30/2020   GLUCOSE 78 10/30/2020   CHOL 188 10/30/2020   TRIG 74.0 10/30/2020   HDL 78.20 10/30/2020   LDLCALC 95 10/30/2020   ALT 13 10/30/2020   AST 22 10/30/2020   NA 139 10/30/2020   K 4.0 10/30/2020   CL 104 10/30/2020   CREATININE 0.84 10/30/2020   BUN 15 10/30/2020   CO2 29 10/30/2020   TSH 2.72 10/30/2020        Assessment & Plan:

## 2020-11-02 ENCOUNTER — Encounter: Payer: Self-pay | Admitting: Internal Medicine

## 2020-11-02 NOTE — Assessment & Plan Note (Signed)
Cont oral replacement 

## 2020-11-02 NOTE — Assessment & Plan Note (Signed)

## 2020-11-15 DIAGNOSIS — U071 COVID-19: Secondary | ICD-10-CM

## 2020-11-15 HISTORY — DX: COVID-19: U07.1

## 2020-11-19 ENCOUNTER — Telehealth: Payer: Self-pay | Admitting: Internal Medicine

## 2020-11-19 NOTE — Telephone Encounter (Signed)
Patient tested positive for Covid-19 this morning using a home test  Symptoms started on Sunday 1.2.22  Experiencing chest congestion (mild), headache, body aches, no fever  What is recommended that she take?  Pharmacy on file is current in the event that a prescription needs to be called in.  Please follow-up with the patient [phone]

## 2020-11-20 NOTE — Telephone Encounter (Signed)
Sent to Dr. John. 

## 2020-11-20 NOTE — Telephone Encounter (Signed)
Pt has been vaccinated by EMR  Ok to refer pt to monoclonal Ab hotline for the 3 day IV remdesivir (not the monoclonal tx)  Ok to tylenol or advil prn pain, mucinex for congestion, and immodium for diarrhea  Let us know if any worsening nausea as we can send med for this

## 2020-11-21 NOTE — Telephone Encounter (Signed)
Message left for patient on what she can take and she has been referred to [facility].

## 2020-12-18 ENCOUNTER — Other Ambulatory Visit: Payer: Self-pay | Admitting: Obstetrics and Gynecology

## 2020-12-18 DIAGNOSIS — Z1231 Encounter for screening mammogram for malignant neoplasm of breast: Secondary | ICD-10-CM

## 2021-02-04 ENCOUNTER — Other Ambulatory Visit: Payer: Self-pay

## 2021-02-04 ENCOUNTER — Ambulatory Visit
Admission: RE | Admit: 2021-02-04 | Discharge: 2021-02-04 | Disposition: A | Discharge status: Home / Self Care | Payer: No Typology Code available for payment source | Source: Ambulatory Visit | Attending: Obstetrics and Gynecology | Admitting: Obstetrics and Gynecology

## 2021-02-04 DIAGNOSIS — Z1231 Encounter for screening mammogram for malignant neoplasm of breast: Secondary | ICD-10-CM

## 2021-04-21 ENCOUNTER — Other Ambulatory Visit: Payer: Self-pay | Admitting: Endocrinology

## 2021-04-21 DIAGNOSIS — E049 Nontoxic goiter, unspecified: Secondary | ICD-10-CM

## 2021-05-11 ENCOUNTER — Ambulatory Visit
Admission: RE | Admit: 2021-05-11 | Discharge: 2021-05-11 | Disposition: A | Discharge status: Home / Self Care | Payer: No Typology Code available for payment source | Source: Ambulatory Visit | Attending: Endocrinology | Admitting: Endocrinology

## 2021-05-11 DIAGNOSIS — E049 Nontoxic goiter, unspecified: Secondary | ICD-10-CM

## 2021-06-01 ENCOUNTER — Other Ambulatory Visit: Payer: Self-pay | Admitting: Endocrinology

## 2021-06-01 DIAGNOSIS — E041 Nontoxic single thyroid nodule: Secondary | ICD-10-CM

## 2021-06-09 NOTE — Progress Notes (Signed)
65 y.o. G62P0000 Married Caucasian female here for annual exam.    Has triamcinolone ointment on hand.  Using periodically for vulvar irritation.  Biopsy showed lichen simplex chronicus.  Has some urinary urgency.  Up once at night.  Not interrupting her day time routine.  Managing ok for now.   Retired now.   Received her Covid vaccine and has not received her booster.  Does not plan for this.   PCP:   Cathlean Cower, MD Endocrinology:  Dr. Suzette Battiest.   Patient's last menstrual period was 11/16/2007 (within months).           Sexually active: No.  The current method of family planning is status post hysterectomy.  Final pathology is CIN I of cervix. Exercising: Yes.    Home exercise routine includes walking 1 hrs per days. Smoker:  no  Health Maintenance: Pap:  05-14-20 Neg:Neg HR HPV, 05-02-19 Neg:Neg HR HPV History of abnormal Pap:  yes, LEEP - CIN III.  Hysterectomy LGSIL on cervix 2009.  Pap of vagina 02/12/15:  LGSIL and negative HR HPV. MMG: 02-04-21 3D/Neg/Birads1 Colonoscopy:  01-19-19 diverticulosis;next due 10 years BMD:  09-05-19  Result :Normal TDaP:  05-02-19 Gardasil:   no HIV: 07-19-19 NR Hep C: 03-26-16 Neg Screening Labs:  PCP   reports that she has never smoked. She has never used smokeless tobacco. She reports previous alcohol use. She reports that she does not use drugs.  Past Medical History:  Diagnosis Date   ASCUS with positive high risk HPV 09/2004   CIN I (cervical intraepithelial neoplasia I) 2005   Condyloma    COVID-19 virus infection 11/2020   Fibroids    GERD (gastroesophageal reflux disease)    Kidney stone     Past Surgical History:  Procedure Laterality Date   CERVICAL BIOPSY  W/ LOOP ELECTRODE EXCISION     CIN 3-Clear margins    MYOMECTOMY  1991   TOTAL ABDOMINAL HYSTERECTOMY  08/27/2008   Aborted R TLH-TAH ov retained; fibroids recurrent CIN, path-fibroids, CIN I    Current Outpatient Medications  Medication Sig Dispense Refill    Multiple Vitamin (MULTIVITAMIN) tablet Take 1 tablet by mouth daily.     triamcinolone (KENALOG) 0.025 % ointment Apply 1 application topically 2 (two) times daily. Uses as needed. 30 g 1   VITAMIN D PO Take by mouth.     No current facility-administered medications for this visit.    Family History  Problem Relation Age of Onset   Congestive Heart Failure Father    Hypertension Mother    Breast cancer Sister 27       Dx'd 03/2016   Breast cancer Maternal Aunt    Breast cancer Paternal Aunt    Breast cancer Paternal Grandmother    Breast cancer Maternal Aunt    Breast cancer Maternal Aunt     Review of Systems  All other systems reviewed and are negative.  Exam:   BP 116/82 (BP Location: Right Arm, Patient Position: Sitting)   Pulse 80   Resp 16   Ht 5' 7.5" (1.715 m)   Wt 144 lb 9.6 oz (65.6 kg)   LMP 11/16/2007 (Within Months)   BMI 22.31 kg/m     General appearance: alert, cooperative and appears stated age Head: normocephalic, without obvious abnormality, atraumatic Neck: no adenopathy, supple, symmetrical, trachea midline and thyroid enlarged on palpation Lungs: clear to auscultation bilaterally Breasts: normal appearance, no masses or tenderness, No nipple retraction or dimpling, No nipple discharge or  bleeding, No axillary adenopathy Heart: regular rate and rhythm Abdomen: soft, non-tender; no masses, no organomegaly Extremities: extremities normal, atraumatic, no cyanosis or edema Skin: skin color, texture, turgor normal. No rashes or lesions Lymph nodes: cervical, supraclavicular, and axillary nodes normal. Neurologic: grossly normal  Pelvic: External genitalia:  no lesions              No abnormal inguinal nodes palpated.              Urethra:  normal appearing urethra with no masses, tenderness or lesions              Bartholins and Skenes: normal                 Vagina: atrophy noted.               Cervix:  absent.               Pap taken: no Bimanual  Exam:  Uterus: absent              Adnexa: no mass, fullness, tenderness              Rectal exam: yes.  Confirms.              Anus:  normal sphincter tone, no lesions  Chaperone was present for exam:  Sharee Pimple, RN  Assessment:   Well woman visit with gynecologic exam. Status post robotic [place], 2009.  Final pathology - CIN I of cervix.  Ovaries retained.  Hx VAIN I with normal colposcopy.  Hx prior CIN III on LEEP likely between 2005 and 2009.  Negative HR HPV status on multiple paps following this.  Pigmentation of the perineum.  Status post benign biopsy. FH breast cancer.   Sister tested negative for genetic mutations.  Off ERT.   Enlarged thryroid.  Status post thyroid biopsy.   Vaginal atrophy.  Hx renal stones.   Plan: Mammogram screening discussed. Self breast awareness reviewed. Pap and HR HPV in 2023.  Guidelines for Calcium, Vitamin D, regular exercise program including cardiovascular and weight bearing exercise.   Follow up annually and prn.   After visit summary provided.

## 2021-06-17 ENCOUNTER — Other Ambulatory Visit: Payer: Self-pay

## 2021-06-17 ENCOUNTER — Encounter: Payer: Self-pay | Admitting: Obstetrics and Gynecology

## 2021-06-17 ENCOUNTER — Ambulatory Visit (INDEPENDENT_AMBULATORY_CARE_PROVIDER_SITE_OTHER): Payer: No Typology Code available for payment source | Admitting: Obstetrics and Gynecology

## 2021-06-17 VITALS — BP 116/82 | HR 80 | Resp 16 | Ht 67.5 in | Wt 144.6 lb

## 2021-06-17 DIAGNOSIS — Z01419 Encounter for gynecological examination (general) (routine) without abnormal findings: Secondary | ICD-10-CM

## 2021-06-17 NOTE — Patient Instructions (Signed)

## 2021-06-24 ENCOUNTER — Other Ambulatory Visit: Payer: Self-pay

## 2021-06-24 ENCOUNTER — Other Ambulatory Visit (HOSPITAL_COMMUNITY)
Admission: RE | Admit: 2021-06-24 | Discharge: 2021-06-24 | Disposition: A | Discharge status: Home / Self Care | Payer: No Typology Code available for payment source | Source: Ambulatory Visit | Attending: Endocrinology | Admitting: Endocrinology

## 2021-06-24 ENCOUNTER — Ambulatory Visit
Admission: RE | Admit: 2021-06-24 | Discharge: 2021-06-24 | Disposition: A | Discharge status: Home / Self Care | Payer: No Typology Code available for payment source | Source: Ambulatory Visit | Attending: Endocrinology | Admitting: Endocrinology

## 2021-06-24 DIAGNOSIS — E041 Nontoxic single thyroid nodule: Secondary | ICD-10-CM | POA: Insufficient documentation

## 2021-06-25 LAB — CYTOLOGY - NON PAP

## 2021-11-03 ENCOUNTER — Ambulatory Visit (INDEPENDENT_AMBULATORY_CARE_PROVIDER_SITE_OTHER): Payer: No Typology Code available for payment source | Admitting: Internal Medicine

## 2021-11-03 ENCOUNTER — Encounter: Payer: Self-pay | Admitting: Internal Medicine

## 2021-11-03 ENCOUNTER — Other Ambulatory Visit: Payer: Self-pay

## 2021-11-03 VITALS — BP 118/62 | HR 68 | Temp 98.2°F | Ht 67.5 in | Wt 145.6 lb

## 2021-11-03 DIAGNOSIS — Z0000 Encounter for general adult medical examination without abnormal findings: Secondary | ICD-10-CM

## 2021-11-03 DIAGNOSIS — R739 Hyperglycemia, unspecified: Secondary | ICD-10-CM | POA: Diagnosis not present

## 2021-11-03 DIAGNOSIS — E559 Vitamin D deficiency, unspecified: Secondary | ICD-10-CM | POA: Diagnosis not present

## 2021-11-03 LAB — CBC WITH DIFFERENTIAL/PLATELET
Basophils Absolute: 0 10*3/uL (ref 0.0–0.1)
Basophils Relative: 1 % (ref 0.0–3.0)
Eosinophils Absolute: 0.1 10*3/uL (ref 0.0–0.7)
Eosinophils Relative: 2.6 % (ref 0.0–5.0)
HCT: 41.4 % (ref 36.0–46.0)
Hemoglobin: 13.7 g/dL (ref 12.0–15.0)
Lymphocytes Relative: 25.5 % (ref 12.0–46.0)
Lymphs Abs: 1.2 10*3/uL (ref 0.7–4.0)
MCHC: 33.2 g/dL (ref 30.0–36.0)
MCV: 92.9 fl (ref 78.0–100.0)
Monocytes Absolute: 0.4 10*3/uL (ref 0.1–1.0)
Monocytes Relative: 9.3 % (ref 3.0–12.0)
Neutro Abs: 2.8 10*3/uL (ref 1.4–7.7)
Neutrophils Relative %: 61.6 % (ref 43.0–77.0)
Platelets: 224 10*3/uL (ref 150.0–400.0)
RBC: 4.45 Mil/uL (ref 3.87–5.11)
RDW: 13.5 % (ref 11.5–15.5)
WBC: 4.5 10*3/uL (ref 4.0–10.5)

## 2021-11-03 LAB — BASIC METABOLIC PANEL
BUN: 12 mg/dL (ref 6–23)
CO2: 30 mEq/L (ref 19–32)
Calcium: 9.7 mg/dL (ref 8.4–10.5)
Chloride: 103 mEq/L (ref 96–112)
Creatinine, Ser: 0.81 mg/dL (ref 0.40–1.20)
GFR: 76.72 mL/min (ref >60.00)
Glucose, Bld: 84 mg/dL (ref 70–99)
Potassium: 3.9 mEq/L (ref 3.5–5.1)
Sodium: 140 mEq/L (ref 135–145)

## 2021-11-03 LAB — URINALYSIS, ROUTINE W REFLEX MICROSCOPIC
Bilirubin Urine: NEGATIVE
Ketones, ur: NEGATIVE
Nitrite: NEGATIVE
Specific Gravity, Urine: <=1.005 — AB (ref 1.000–1.030)
Total Protein, Urine: NEGATIVE
Urine Glucose: NEGATIVE
Urobilinogen, UA: 0.2 (ref 0.0–1.0)
pH: 7 (ref 5.0–8.0)

## 2021-11-03 LAB — HEMOGLOBIN A1C: Hgb A1c MFr Bld: 5.4 % (ref 4.6–6.5)

## 2021-11-03 LAB — HEPATIC FUNCTION PANEL
ALT: 13 U/L (ref 0–35)
AST: 23 U/L (ref 0–37)
Albumin: 4.2 g/dL (ref 3.5–5.2)
Alkaline Phosphatase: 71 U/L (ref 39–117)
Bilirubin, Direct: 0.1 mg/dL (ref 0.0–0.3)
Total Bilirubin: 0.9 mg/dL (ref 0.2–1.2)
Total Protein: 7.2 g/dL (ref 6.0–8.3)

## 2021-11-03 LAB — LIPID PANEL
Cholesterol: 205 mg/dL — ABNORMAL HIGH (ref 0–200)
HDL: 80.2 mg/dL (ref >39.00)
LDL Cholesterol: 107 mg/dL — ABNORMAL HIGH (ref 0–99)
NonHDL: 125.03
Total CHOL/HDL Ratio: 3
Triglycerides: 89 mg/dL (ref 0.0–149.0)
VLDL: 17.8 mg/dL (ref 0.0–40.0)

## 2021-11-03 LAB — TSH: TSH: 1.72 u[IU]/mL (ref 0.35–5.50)

## 2021-11-03 LAB — VITAMIN D 25 HYDROXY (VIT D DEFICIENCY, FRACTURES): VITD: 45.47 ng/mL (ref 30.00–100.00)

## 2021-11-03 NOTE — Progress Notes (Signed)
Patient ID: [name], female   DOB: [birth year], 65 y.o.   MRN: [id number]         Chief Complaint:: wellness exam and low vit d       HPI:  [name] is a 65 y.o. female here for wellness exam; overall doing well, declines covid booster, flu shot, o/w up to date    Not taking Vit d  Pt denies chest pain, increased sob or doe, wheezing, orthopnea, PND, increased LE swelling, palpitations, dizziness or syncope.   Pt denies polydipsia, polyuria, or new focal neuro s/s.   Pt denies fever, wt loss, night sweats, loss of appetite, or other constitutional symptoms  No other new complaints  Wt Readings from Last 3 Encounters:  11/03/21 145 lb 9.6 oz (66 kg)  06/17/21 144 lb 9.6 oz (65.6 kg)  10/30/20 137 lb (62.1 kg)   BP Readings from Last 3 Encounters:  11/03/21 118/62  06/17/21 116/82  10/30/20 100/68   Immunization History  Administered Date(s) Administered   Influenza,inj,Quad PF,6+ Mos 07/19/2019   Moderna Sars-Covid-2 Vaccination 01/26/2020, 02/26/2020   Tdap 05/02/2019   Zoster Recombinat (Shingrix) 07/20/2019, 11/03/2019  There are no preventive care reminders to display for this patient.    Past Medical History:  Diagnosis Date   ASCUS with positive high risk HPV 09/2004   CIN I (cervical intraepithelial neoplasia I) 2005   Condyloma    COVID-19 virus infection 11/2020   Fibroids    GERD (gastroesophageal reflux disease)    Kidney stone    Past Surgical History:  Procedure Laterality Date   CERVICAL BIOPSY  W/ LOOP ELECTRODE EXCISION     CIN 3-Clear margins    MYOMECTOMY  1991   TOTAL ABDOMINAL HYSTERECTOMY  08/27/2008   Aborted R TLH-TAH ov retained; fibroids recurrent CIN, path-fibroids, CIN I    reports that she has never smoked. She has never used smokeless tobacco. She reports that she does not currently use alcohol. She reports that she does not use drugs. family history includes Breast cancer in her maternal aunt, maternal aunt, maternal aunt, paternal  aunt, and paternal grandmother; Breast cancer (age of onset: 25) in her sister; Congestive Heart Failure in her father; Hypertension in her mother. No Known Allergies Current Outpatient Medications on File Prior to Visit  Medication Sig Dispense Refill   Multiple Vitamin (MULTIVITAMIN) tablet Take 1 tablet by mouth daily.     triamcinolone (KENALOG) 0.025 % ointment Apply 1 application topically 2 (two) times daily. Uses as needed. 30 g 1   VITAMIN D PO Take by mouth.     No current facility-administered medications on file prior to visit.        ROS:  All others reviewed and negative.  Objective        PE:  BP 118/62 (BP Location: Right Arm, Patient Position: Sitting, Cuff Size: Normal)    Pulse 68    Temp 98.2 F (36.8 C) (Oral)    Ht 5' 7.5" (1.715 m)    Wt 145 lb 9.6 oz (66 kg)    LMP 11/16/2007 (Within Months)    SpO2 98%    BMI 22.47 kg/m                 Constitutional: Pt appears in NAD               HENT: Head: NCAT.                Right Ear:  External ear normal.                 Left Ear: External ear normal.                Eyes: . Pupils are equal, round, and reactive to light. Conjunctivae and EOM are normal               Nose: without d/c or deformity               Neck: Neck supple. Gross normal ROM               Cardiovascular: Normal rate and regular rhythm.                 Pulmonary/Chest: Effort normal and breath sounds without rales or wheezing.                Abd:  Soft, NT, ND, + BS, no organomegaly               Neurological: Pt is alert. At baseline orientation, motor grossly intact               Skin: Skin is warm. No rashes, no other new lesions, LE edema - none               Psychiatric: Pt behavior is normal without agitation   Micro: none  Cardiac tracings I have personally interpreted today:  none  Pertinent Radiological findings (summarize): none   Lab Results  Component Value Date   WBC 4.5 11/03/2021   HGB 13.7 11/03/2021   HCT 41.4 11/03/2021    PLT 224.0 11/03/2021   GLUCOSE 84 11/03/2021   CHOL 205 (H) 11/03/2021   TRIG 89.0 11/03/2021   HDL 80.20 11/03/2021   LDLCALC 107 (H) 11/03/2021   ALT 13 11/03/2021   AST 23 11/03/2021   NA 140 11/03/2021   K 3.9 11/03/2021   CL 103 11/03/2021   CREATININE 0.81 11/03/2021   BUN 12 11/03/2021   CO2 30 11/03/2021   TSH 1.72 11/03/2021   HGBA1C 5.4 11/03/2021   Assessment/Plan:  [name] is a 65 y.o. White or Caucasian [1] female with  has a past medical history of ASCUS with positive high risk HPV (09/2004), CIN I (cervical intraepithelial neoplasia I) (2005), Condyloma, COVID-19 virus infection (11/2020), Fibroids, GERD (gastroesophageal reflux disease), and Kidney stone.  Vitamin D deficiency Last vitamin D Lab Results  Component Value Date   VD25OH 35.20 10/30/2020   Low, to start oral replacement   Preventative health care Age and sex appropriate education and counseling updated with regular exercise and diet Referrals for preventative services - none needed Immunizations addressed - decliens covid booster and flu shot Smoking counseling  - none needed Evidence for depression or other mood disorder - chronic mild anxiety no change Most recent labs reviewed. I have personally reviewed and have noted: 1) the patient's medical and social history 2) The patient's current medications and supplements 3) The patient's height, weight, and BMI have been recorded in the chart  Followup: Return in about 1 year (around 11/03/2022).  Cathlean Cower, MD 11/06/2021 7:17 PM [facility]

## 2021-11-03 NOTE — Assessment & Plan Note (Signed)
Last vitamin D Lab Results  Component Value Date   VD25OH 35.20 10/30/2020   Low, to start oral replacement

## 2021-11-03 NOTE — Patient Instructions (Signed)

## 2021-11-06 ENCOUNTER — Encounter: Payer: Self-pay | Admitting: Internal Medicine

## 2021-11-06 NOTE — Assessment & Plan Note (Signed)
Age and sex appropriate education and counseling updated with regular exercise and diet Referrals for preventative services - none needed Immunizations addressed - decliens covid booster and flu shot Smoking counseling  - none needed Evidence for depression or other mood disorder - chronic mild anxiety no change Most recent labs reviewed. I have personally reviewed and have noted: 1) the patient's medical and social history 2) The patient's current medications and supplements 3) The patient's height, weight, and BMI have been recorded in the chart

## 2022-01-11 ENCOUNTER — Other Ambulatory Visit: Payer: Self-pay | Admitting: Obstetrics and Gynecology

## 2022-01-11 DIAGNOSIS — Z1231 Encounter for screening mammogram for malignant neoplasm of breast: Secondary | ICD-10-CM

## 2022-02-09 ENCOUNTER — Ambulatory Visit
Admission: RE | Admit: 2022-02-09 | Discharge: 2022-02-09 | Disposition: A | Discharge status: Home / Self Care | Payer: No Typology Code available for payment source | Source: Ambulatory Visit | Attending: Obstetrics and Gynecology | Admitting: Obstetrics and Gynecology

## 2022-02-09 DIAGNOSIS — Z1231 Encounter for screening mammogram for malignant neoplasm of breast: Secondary | ICD-10-CM

## 2022-02-22 IMAGING — US US THYROID
1 series · 13 of 25 positions shown · non-contrast
Comparison: 04/25/2019

CLINICAL DATA: 63-year-old female with a history of thyroid goiter

Baseline ultrasound 04/07/2017
Prior biopsy reported of left thyroid nodule
EXAM:
THYROID ULTRASOUND
TECHNIQUE: Ultrasound examination of the thyroid gland and adjacent soft
tissues was performed.

[Series 1: us thyroid · 0.07mm/px · 13 of 48 slices shown]
[im 1/48]
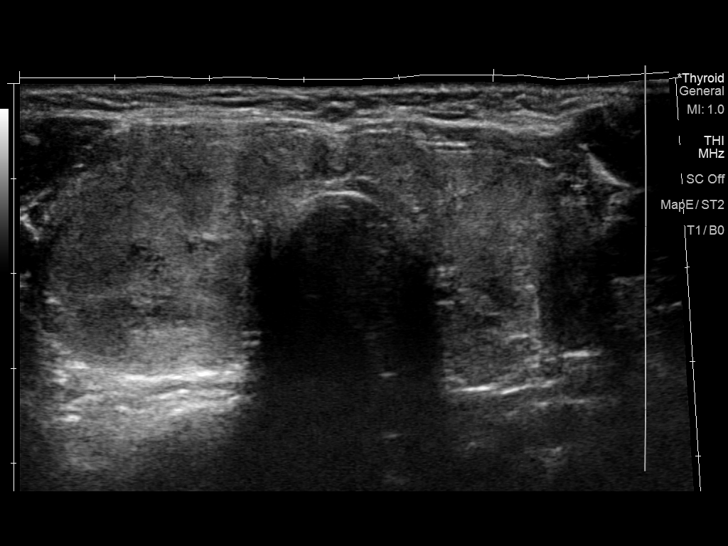
[im 4/48]
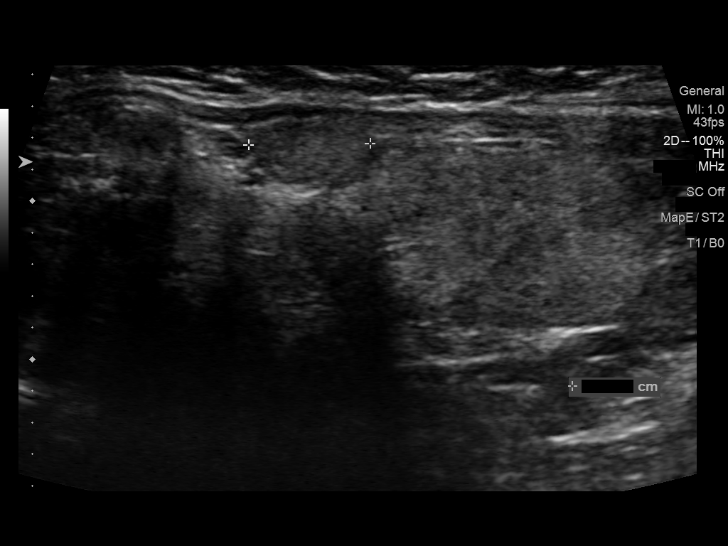
[im 8/48]
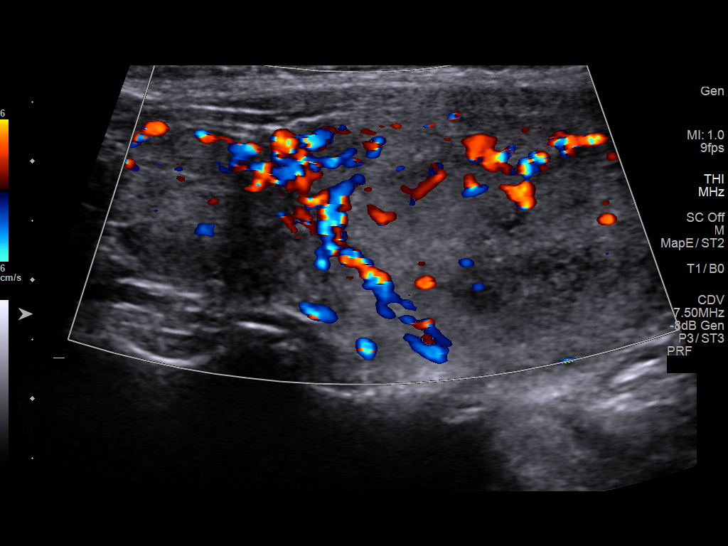
[im 12/48]
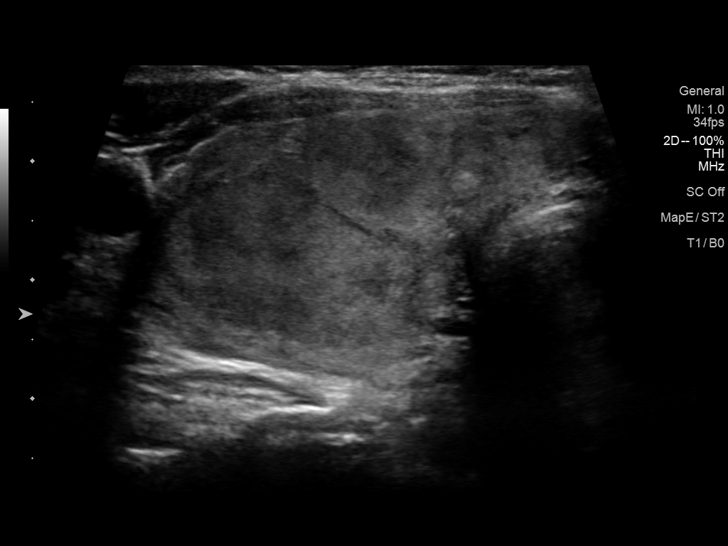
[im 16/48]
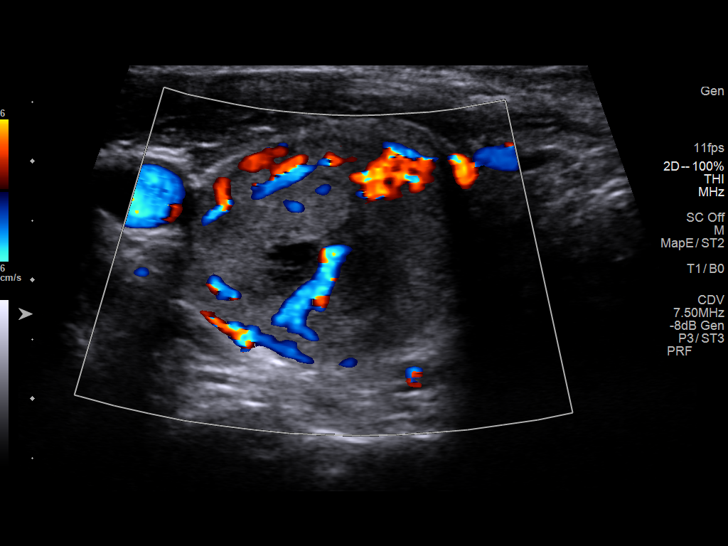
[im 20/48]
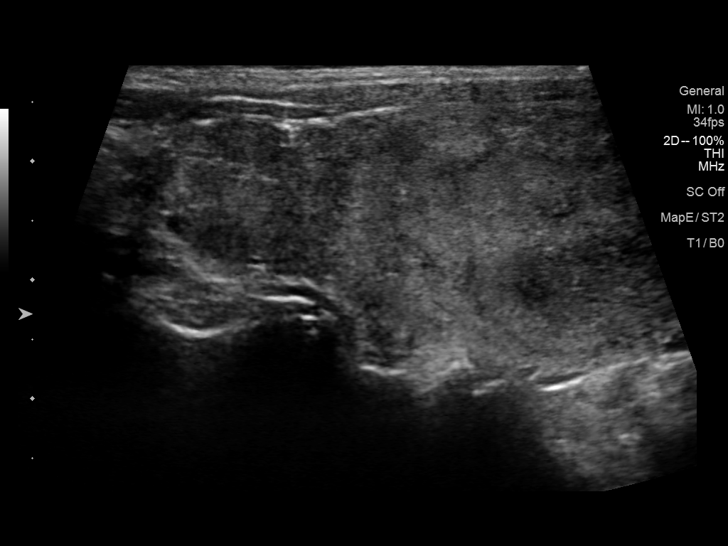
[im 24/48]
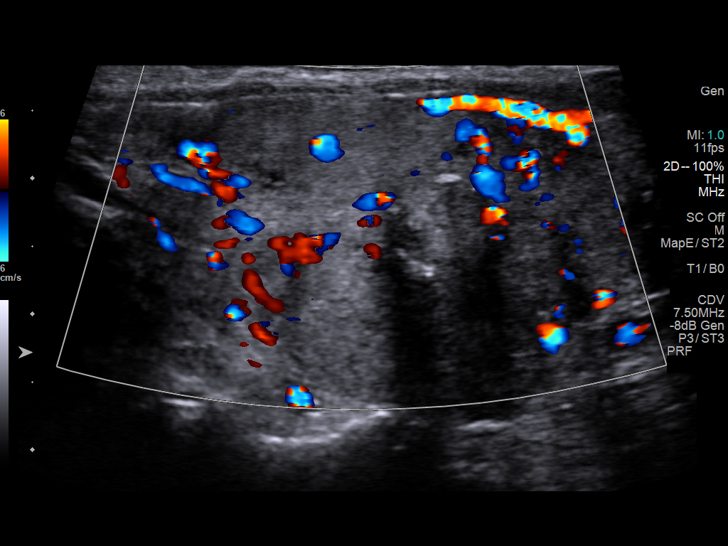
[im 28/48]
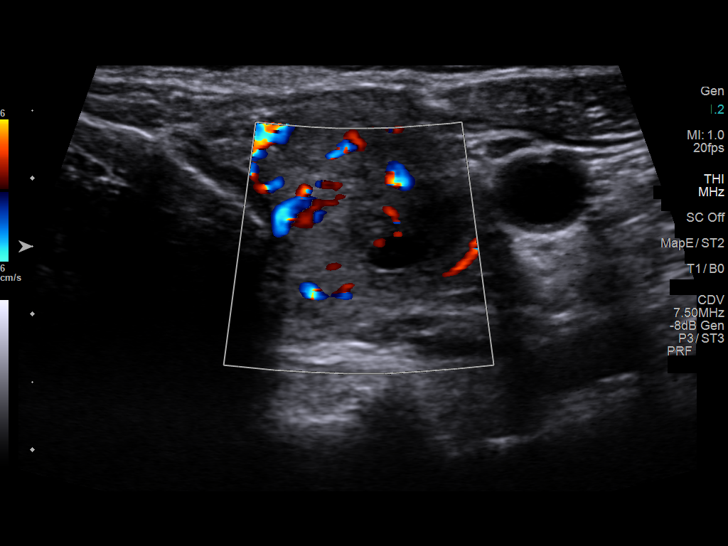
[im 32/48]
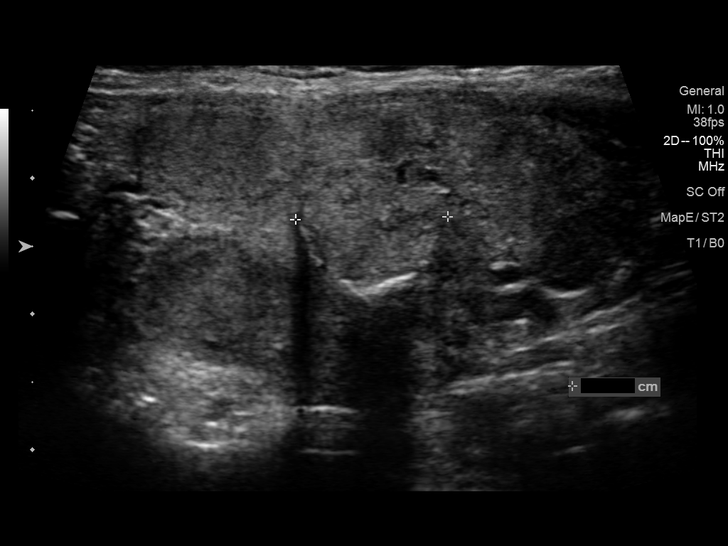
[im 36/48]
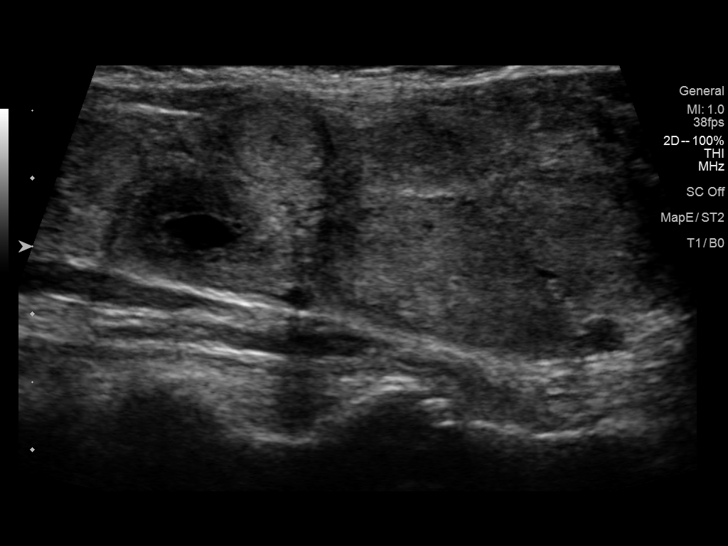
[im 40/48]
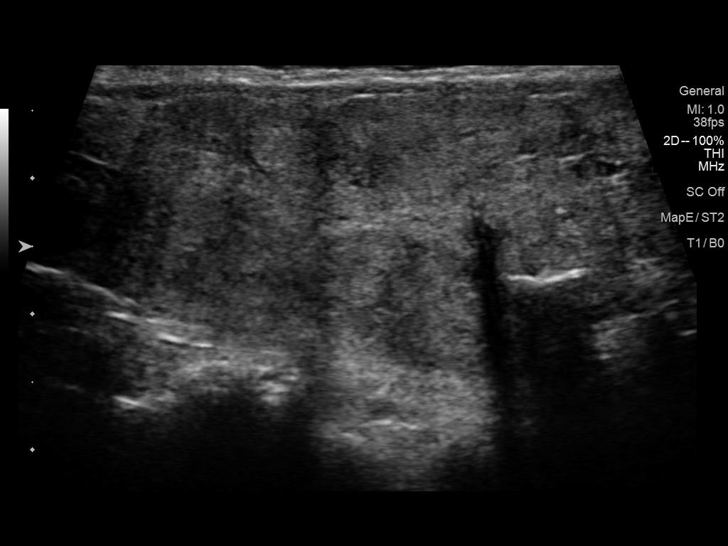
[im 44/48]
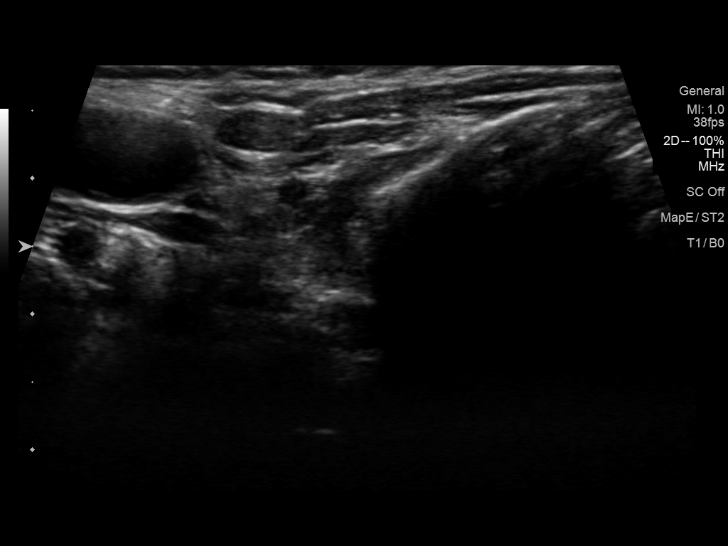
[im 48/48]
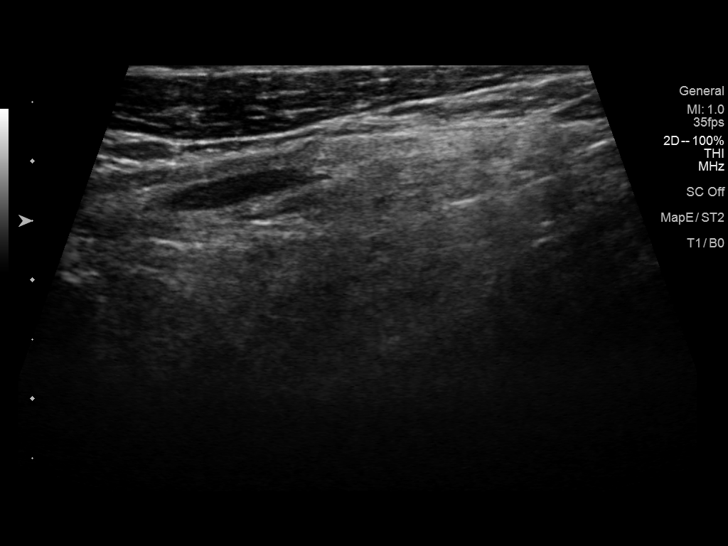

[13 of 25 positions shown; findings below may reference images not displayed]

FINDINGS: Parenchymal Echotexture: Moderately heterogenous

Isthmus: 0.6 cm

Right lobe: 5.4 cm x 2.2 cm x 3.1 cm

Left lobe: 5.6 cm x 2.3 cm x 2.2 cm

_________________________________________________________

Estimated total number of nodules >/= 1 cm: 3

Number of spongiform nodules >/=  2 cm not described below (TR1): 0

Number of mixed cystic and solid nodules >/= 1.5 cm not described
below (TR2): 0

_________________________________________________________

Nodule # 1:

Location: Isthmus; left

Maximum size: 0.8 cm; Other 2 dimensions: 0.8 cm x 0.4 cm

Composition: solid/almost completely solid (2)

Echogenicity: isoechoic (1)

Shape: not taller-than-wide (0)

Margins: smooth (0)

Echogenic foci: none (0)

ACR TI-RADS total points: 3.

ACR TI-RADS risk category: TR3 (3 points).

ACR TI-RADS recommendations:

Nodule does not meet criteria for surveillance or biopsy

_________________________________________________________

Nodule labeled 2, inferior right thyroid, 3.2 cm, relatively
unchanged. This nodule has been previously biopsied. Assuming benign
result no further specific follow-up would be indicated.

Nodule labeled 3, superior left thyroid, 1.2 cm, smaller than the
prior. Nodule remains TR 4. Continued surveillance is indicated.

Nodule labeled 4, mid left thyroid, 1.1 cm. Nodule is unchanged, TR
4. Nodule meets criteria for continued surveillance.

No adenopathy
IMPRESSION: Left superior thyroid nodule (labeled 3, 1.2 cm, TR 4) and the left
mid thyroid nodule (labeled 4, 1.1 cm, TR 4) both meet criteria for
continued surveillance, as designated by the newly established ACR
TI-RADS criteria. Surveillance ultrasound study recommended to be
performed annually up to 5 years.

Recommendations follow those established by the new ACR TI-RADS
criteria ([HOSPITAL] 1037;[DATE]).

## 2022-05-11 ENCOUNTER — Other Ambulatory Visit: Payer: Self-pay | Admitting: Endocrinology

## 2022-05-11 DIAGNOSIS — E049 Nontoxic goiter, unspecified: Secondary | ICD-10-CM

## 2022-06-21 NOTE — Progress Notes (Unsigned)
66 y.o. G62P0000 Married Caucasian female here for annual breast and pelvic exam.    Patient is followed for vulvar irritation.  Biopsy showed lichen simplex chronicus. Uses Kenalog as needed.  PCP:   Cathlean Cower, MD  Patient's last menstrual period was 11/16/2007 (within months).           Sexually active: Seldom The current method of family planning is status post hysterectomy.    Exercising: Yes.     Fitness walking Smoker:  no  Health Maintenance: Pap:05-14-20 Neg:Neg HR HPV, 05-02-19 Neg:Neg HR HPV History of abnormal Pap:  yes, LEEP - CIN III.  Hysterectomy LGSIL on cervix 2009.  Pap of vagina 02/12/15:  LGSIL and negative HR HPV. MMG: 02-09-22 Neg/Birads1 Colonoscopy: 01-19-19 diverticulosis;next due 10 years BMD:  09-05-19  Result :Normal TDaP:  05-02-19 Gardasil:   no HIV:07-19-19 NR Hep C:03-26-16 Neg Screening Labs:  PCP   reports that she has never smoked. She has never used smokeless tobacco. She reports that she does not currently use alcohol. She reports that she does not use drugs.  Past Medical History:  Diagnosis Date   ASCUS with positive high risk HPV 09/2004   CIN I (cervical intraepithelial neoplasia I) 2005   Condyloma    COVID-19 virus infection 11/2020   Fibroids    GERD (gastroesophageal reflux disease)    Kidney stone     Past Surgical History:  Procedure Laterality Date   CERVICAL BIOPSY  W/ LOOP ELECTRODE EXCISION     CIN 3-Clear margins    MYOMECTOMY  1991   TOTAL ABDOMINAL HYSTERECTOMY  08/27/2008   Aborted R TLH-TAH ov retained; fibroids recurrent CIN, path-fibroids, CIN I    Current Outpatient Medications  Medication Sig Dispense Refill   chlorhexidine (PERIDEX) 0.12 % solution SMARTSIG:By Mouth     Multiple Vitamin (MULTIVITAMIN) tablet Take 1 tablet by mouth daily.     Multiple Vitamins-Minerals (PRESERVISION AREDS) TABS See admin instructions.     triamcinolone (KENALOG) 0.025 % ointment Apply 1 application topically 2 (two) times daily.  Uses as needed. 30 g 1   VITAMIN D PO Take by mouth.     Zinc 100 MG TABS 1 tablet     No current facility-administered medications for this visit.    Family History  Problem Relation Age of Onset   Congestive Heart Failure Father    Hypertension Mother    Breast cancer Sister 82       Dx'd 03/2016   Breast cancer Maternal Aunt    Breast cancer Paternal Aunt    Breast cancer Paternal Grandmother    Breast cancer Maternal Aunt    Breast cancer Maternal Aunt     Review of Systems  All other systems reviewed and are negative.   Exam:   BP 100/62   Pulse 78   Ht 5' 6.5" (1.689 m)   Wt 141 lb (64 kg)   LMP 11/16/2007 (Within Months)   SpO2 98%   BMI 22.42 kg/m     General appearance: alert, cooperative and appears stated age Head: normocephalic, without obvious abnormality, atraumatic Neck: no adenopathy, supple, symmetrical, trachea midline and thyroid mild enlargement bilaterally.  Lungs: clear to auscultation bilaterally Breasts: normal appearance, no masses or tenderness, No nipple retraction or dimpling, No nipple discharge or bleeding, No axillary adenopathy Heart: regular rate and rhythm Abdomen: soft, non-tender; no masses, no organomegaly Extremities: extremities normal, atraumatic, no cyanosis or edema Skin: skin color, texture, turgor normal. No rashes or lesions Lymph  nodes: cervical, supraclavicular, and axillary nodes normal. Neurologic: grossly normal  Pelvic: External genitalia:  small area of brown pigmentation of the superior perineum at vaginal opening.               No abnormal inguinal nodes palpated.              Urethra:  normal appearing urethra with no masses, tenderness or lesions              Bartholins and Skenes: normal                 Vagina: normal appearing vagina with normal color and discharge, no lesions.  Atrophy noted.               Cervix:  absent.                Pap taken: yes Bimanual Exam:  Uterus:  absent              Adnexa: no  mass, fullness, tenderness              Rectal exam: Yes.  .  Confirms.              Anus:  normal sphincter tone, no lesions  Chaperone was present for exam:   Estill Bamberg, CMA  Assessment:   Well woman visit with gynecologic exam. Status post robotic [place], 2009.  Final pathology - CIN I of cervix.  Ovaries retained.  Hx VAIN I with normal colposcopy.  Hx prior CIN III on LEEP likely between 2005 and 2009.  Negative HR HPV status on multiple paps following this.  Pigmentation of the perineum.  Status post benign biopsy.   Hx chronic vulvutis.  FH breast cancer.   Sister tested negative for genetic mutations.  Enlarged thryroid.  Status post thyroid biopsy.   Vaginal atrophy.  Hx renal stones.   Plan: Mammogram screening discussed. Self breast awareness reviewed. Pap and HR HPV as above. Pap and HR HPV every 3 year for 25 years following hysterectomy.  Guidelines for Calcium, Vitamin D, regular exercise program including cardiovascular and weight bearing exercise. Refill of triamcinolone.  We discussed treatments for atrophy.  She declines vaginal estrogens.  She may try vit E.  We reviewed genetic counseling and calculation of lifetime risk of breast cancer and potential referral into the high risk breast cancer screening clinic.  She will consider.  Patient will decide if she would like to return yearly.  After visit summary provided.   25 min  total time was spent for this patient encounter, including preparation, face-to-face counseling with the patient, coordination of care, and documentation of the encounter.

## 2022-06-23 ENCOUNTER — Ambulatory Visit (INDEPENDENT_AMBULATORY_CARE_PROVIDER_SITE_OTHER): Payer: Medicare Other | Admitting: Obstetrics and Gynecology

## 2022-06-23 ENCOUNTER — Encounter: Payer: Self-pay | Admitting: Obstetrics and Gynecology

## 2022-06-23 ENCOUNTER — Other Ambulatory Visit (HOSPITAL_COMMUNITY)
Admission: RE | Admit: 2022-06-23 | Discharge: 2022-06-23 | Disposition: A | Discharge status: Home / Self Care | Payer: Medicare Other | Source: Ambulatory Visit | Attending: Obstetrics and Gynecology | Admitting: Obstetrics and Gynecology

## 2022-06-23 VITALS — BP 100/62 | HR 78 | Ht 66.5 in | Wt 141.0 lb

## 2022-06-23 DIAGNOSIS — Z803 Family history of malignant neoplasm of breast: Secondary | ICD-10-CM

## 2022-06-23 DIAGNOSIS — Z01419 Encounter for gynecological examination (general) (routine) without abnormal findings: Secondary | ICD-10-CM | POA: Diagnosis not present

## 2022-06-23 DIAGNOSIS — Z87410 Personal history of cervical dysplasia: Secondary | ICD-10-CM | POA: Diagnosis not present

## 2022-06-23 DIAGNOSIS — Z1272 Encounter for screening for malignant neoplasm of vagina: Secondary | ICD-10-CM | POA: Diagnosis not present

## 2022-06-23 DIAGNOSIS — Z1151 Encounter for screening for human papillomavirus (HPV): Secondary | ICD-10-CM | POA: Insufficient documentation

## 2022-06-23 DIAGNOSIS — N763 Subacute and chronic vulvitis: Secondary | ICD-10-CM | POA: Diagnosis not present

## 2022-06-23 DIAGNOSIS — N952 Postmenopausal atrophic vaginitis: Secondary | ICD-10-CM | POA: Diagnosis not present

## 2022-06-23 MED ORDER — TRIAMCINOLONE ACETONIDE 0.025 % EX OINT
1.0000 | TOPICAL_OINTMENT | Freq: Two times a day (BID) | CUTANEOUS | 1 refills | Status: DC
Start: 2022-06-23 — End: 2024-06-25

## 2022-06-23 NOTE — Patient Instructions (Signed)

## 2022-06-24 LAB — CYTOLOGY - PAP
Comment: NEGATIVE
Diagnosis: NEGATIVE
High risk HPV: NEGATIVE

## 2022-11-03 ENCOUNTER — Other Ambulatory Visit: Payer: Medicare Other

## 2022-11-03 ENCOUNTER — Other Ambulatory Visit (INDEPENDENT_AMBULATORY_CARE_PROVIDER_SITE_OTHER): Payer: Medicare Other

## 2022-11-03 DIAGNOSIS — E559 Vitamin D deficiency, unspecified: Secondary | ICD-10-CM | POA: Diagnosis not present

## 2022-11-03 DIAGNOSIS — Z0000 Encounter for general adult medical examination without abnormal findings: Secondary | ICD-10-CM

## 2022-11-03 DIAGNOSIS — R739 Hyperglycemia, unspecified: Secondary | ICD-10-CM

## 2022-11-03 LAB — CBC WITH DIFFERENTIAL/PLATELET
Basophils Absolute: 0 10*3/uL (ref 0.0–0.1)
Basophils Relative: 1 % (ref 0.0–3.0)
Eosinophils Absolute: 0.1 10*3/uL (ref 0.0–0.7)
Eosinophils Relative: 1.8 % (ref 0.0–5.0)
HCT: 40.6 % (ref 36.0–46.0)
Hemoglobin: 13.7 g/dL (ref 12.0–15.0)
Lymphocytes Relative: 22.4 % (ref 12.0–46.0)
Lymphs Abs: 1.2 10*3/uL (ref 0.7–4.0)
MCHC: 33.8 g/dL (ref 30.0–36.0)
MCV: 93.3 fl (ref 78.0–100.0)
Monocytes Absolute: 0.5 10*3/uL (ref 0.1–1.0)
Monocytes Relative: 10.3 % (ref 3.0–12.0)
Neutro Abs: 3.4 10*3/uL (ref 1.4–7.7)
Neutrophils Relative %: 64.5 % (ref 43.0–77.0)
Platelets: 250 10*3/uL (ref 150.0–400.0)
RBC: 4.35 Mil/uL (ref 3.87–5.11)
RDW: 13.7 % (ref 11.5–15.5)
WBC: 5.3 10*3/uL (ref 4.0–10.5)

## 2022-11-03 LAB — URINALYSIS, ROUTINE W REFLEX MICROSCOPIC
Bilirubin Urine: NEGATIVE
Ketones, ur: NEGATIVE
Nitrite: NEGATIVE
Specific Gravity, Urine: 1.01 (ref 1.000–1.030)
Total Protein, Urine: NEGATIVE
Urine Glucose: NEGATIVE
Urobilinogen, UA: 0.2 (ref 0.0–1.0)
pH: 7 (ref 5.0–8.0)

## 2022-11-03 LAB — LIPID PANEL
Cholesterol: 208 mg/dL — ABNORMAL HIGH (ref 0–200)
HDL: 79 mg/dL (ref >39.00)
LDL Cholesterol: 111 mg/dL — ABNORMAL HIGH (ref 0–99)
NonHDL: 128.77
Total CHOL/HDL Ratio: 3
Triglycerides: 88 mg/dL (ref 0.0–149.0)
VLDL: 17.6 mg/dL (ref 0.0–40.0)

## 2022-11-03 LAB — HEPATIC FUNCTION PANEL
ALT: 11 U/L (ref 0–35)
AST: 19 U/L (ref 0–37)
Albumin: 4.1 g/dL (ref 3.5–5.2)
Alkaline Phosphatase: 75 U/L (ref 39–117)
Bilirubin, Direct: 0.1 mg/dL (ref 0.0–0.3)
Total Bilirubin: 0.8 mg/dL (ref 0.2–1.2)
Total Protein: 7 g/dL (ref 6.0–8.3)

## 2022-11-03 LAB — BASIC METABOLIC PANEL
BUN: 12 mg/dL (ref 6–23)
CO2: 29 mEq/L (ref 19–32)
Calcium: 9.5 mg/dL (ref 8.4–10.5)
Chloride: 102 mEq/L (ref 96–112)
Creatinine, Ser: 0.97 mg/dL (ref 0.40–1.20)
GFR: 61.37 mL/min (ref >60.00)
Glucose, Bld: 92 mg/dL (ref 70–99)
Potassium: 4.2 mEq/L (ref 3.5–5.1)
Sodium: 139 mEq/L (ref 135–145)

## 2022-11-03 LAB — VITAMIN D 25 HYDROXY (VIT D DEFICIENCY, FRACTURES): VITD: 67 ng/mL (ref 30.00–100.00)

## 2022-11-03 LAB — HEMOGLOBIN A1C: Hgb A1c MFr Bld: 5.3 % (ref 4.6–6.5)

## 2022-11-03 LAB — TSH: TSH: 2.54 u[IU]/mL (ref 0.35–5.50)

## 2022-11-10 ENCOUNTER — Encounter: Payer: No Typology Code available for payment source | Admitting: Internal Medicine

## 2022-11-11 ENCOUNTER — Encounter: Payer: No Typology Code available for payment source | Admitting: Internal Medicine

## 2022-11-16 ENCOUNTER — Telehealth: Payer: Self-pay | Admitting: Internal Medicine

## 2022-11-16 MED ORDER — CEPHALEXIN 500 MG PO CAPS: 500.0000 mg | ORAL_CAPSULE | Freq: Three times a day (TID) | ORAL | 0 refills | Status: DC

## 2022-11-16 NOTE — Telephone Encounter (Signed)
Ok I sent rx for cephalexin

## 2022-11-16 NOTE — Telephone Encounter (Signed)
Patient called and said that her labs from 11/03/22 showed she had a UTI and she said the symptoms are getting worse and she wanted to know if Dr Jenny Reichmann could send something in. Please advise. Call back is [phone]

## 2022-11-16 NOTE — Telephone Encounter (Signed)
Please advise as there isn't a result note

## 2022-11-17 NOTE — Telephone Encounter (Signed)
Patient informed of Rx to pharmacy.

## 2022-11-23 ENCOUNTER — Ambulatory Visit (INDEPENDENT_AMBULATORY_CARE_PROVIDER_SITE_OTHER): Payer: Medicare Other | Admitting: Internal Medicine

## 2022-11-23 VITALS — BP 108/60 | HR 80 | Temp 98.6°F | Ht 66.5 in | Wt 143.0 lb

## 2022-11-23 DIAGNOSIS — E559 Vitamin D deficiency, unspecified: Secondary | ICD-10-CM

## 2022-11-23 DIAGNOSIS — R3915 Urgency of urination: Secondary | ICD-10-CM

## 2022-11-23 DIAGNOSIS — R300 Dysuria: Secondary | ICD-10-CM | POA: Diagnosis not present

## 2022-11-23 DIAGNOSIS — M545 Low back pain, unspecified: Secondary | ICD-10-CM

## 2022-11-23 DIAGNOSIS — E785 Hyperlipidemia, unspecified: Secondary | ICD-10-CM | POA: Diagnosis not present

## 2022-11-23 LAB — URINALYSIS, ROUTINE W REFLEX MICROSCOPIC
Bilirubin Urine: NEGATIVE
Ketones, ur: NEGATIVE
Leukocytes,Ua: NEGATIVE
Nitrite: NEGATIVE
Specific Gravity, Urine: <=1.005 — AB (ref 1.000–1.030)
Total Protein, Urine: NEGATIVE
Urine Glucose: NEGATIVE
Urobilinogen, UA: 0.2 (ref 0.0–1.0)
pH: 7 (ref 5.0–8.0)

## 2022-11-23 IMAGING — MG MM DIGITAL SCREENING BILAT W/ TOMO AND CAD
8 series · 9 of 24 positions shown · non-contrast
Comparison: Previous exam(s).

CLINICAL DATA: Screening.

EXAM:
DIGITAL SCREENING BILATERAL MAMMOGRAM WITH TOMOSYNTHESIS AND CAD
TECHNIQUE: Bilateral screening digital craniocaudal and mediolateral oblique
mammograms were obtained. Bilateral screening digital breast
tomosynthesis was performed. The images were evaluated with
computer-aided detection.

[L CC synth-2D]
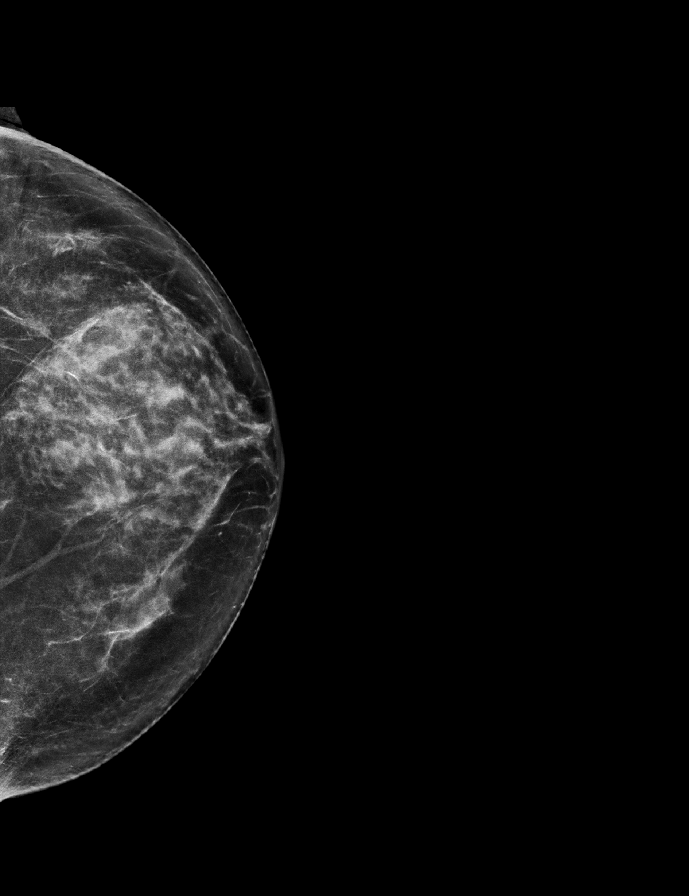

[L MLO synth-2D]
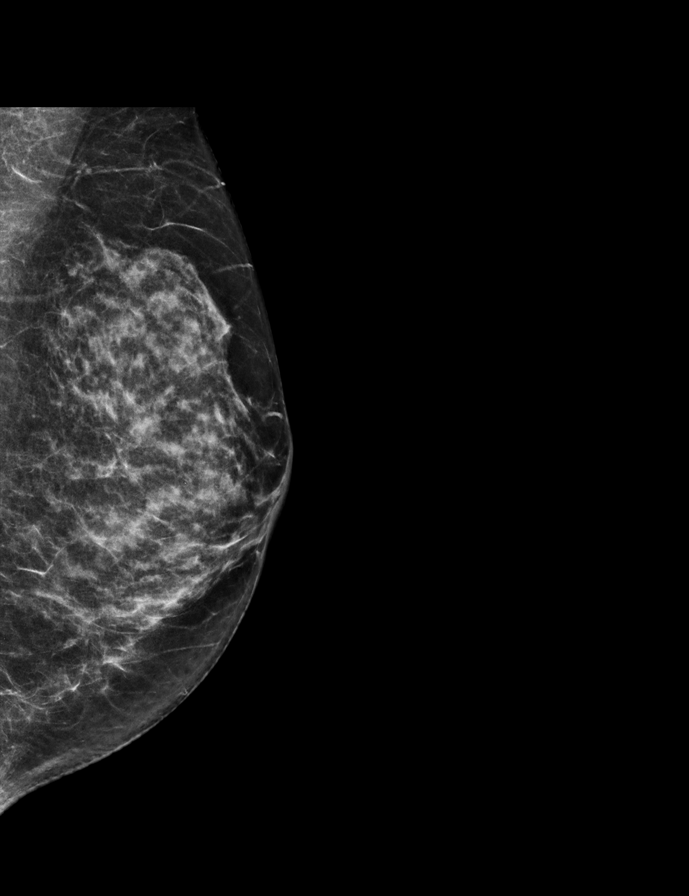

[R MLO synth-2D]
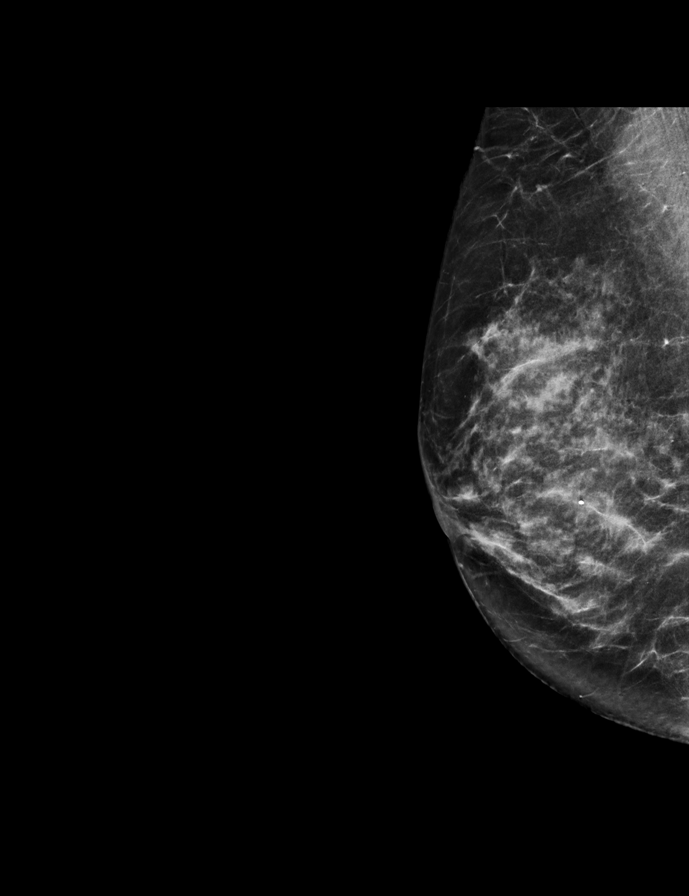

[R CC synth-2D]
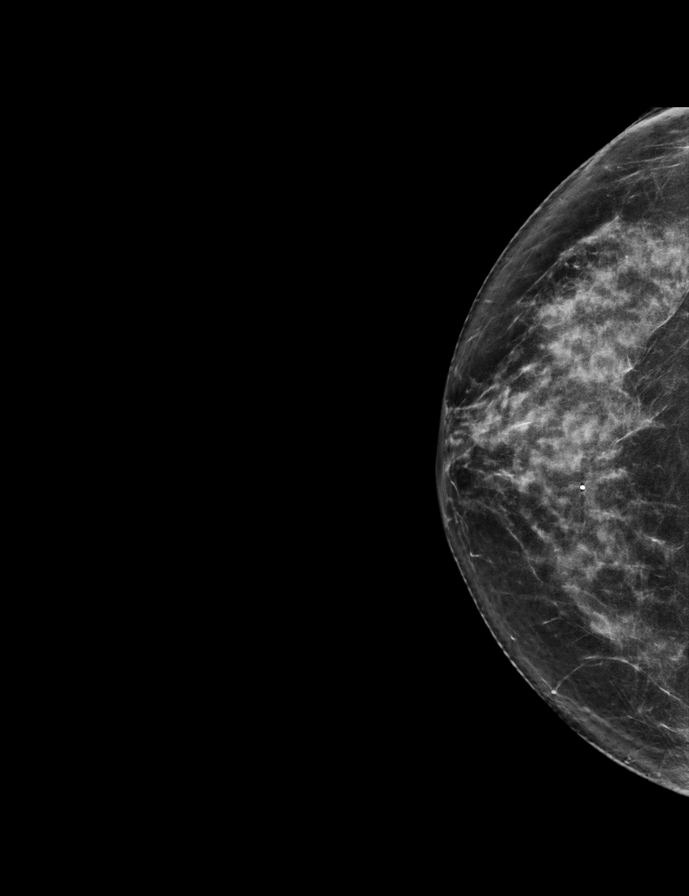

[R MLO tomo · 2 of 61 frames shown]
[frame 20/61]
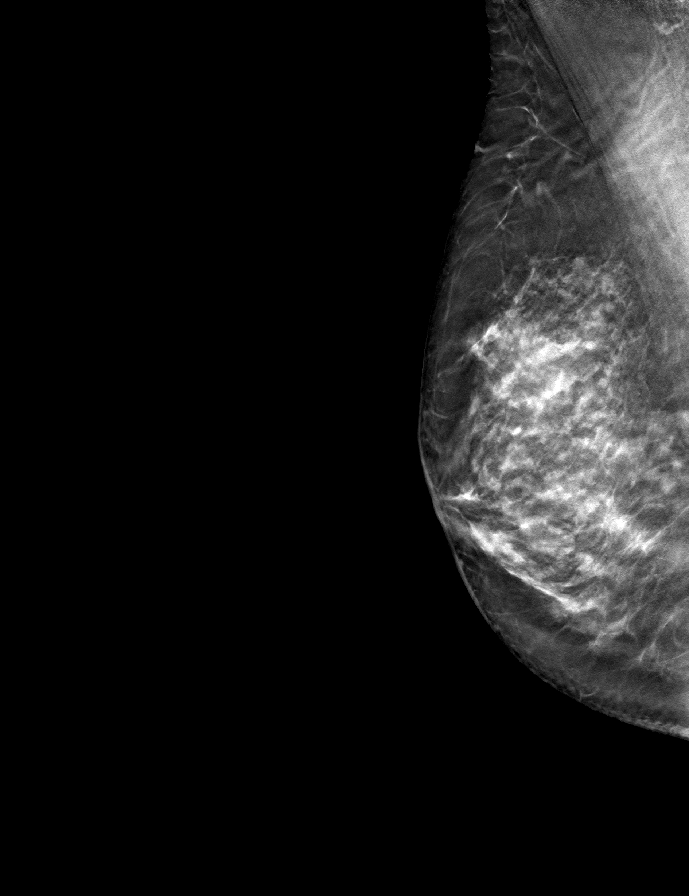
[frame 31/61]
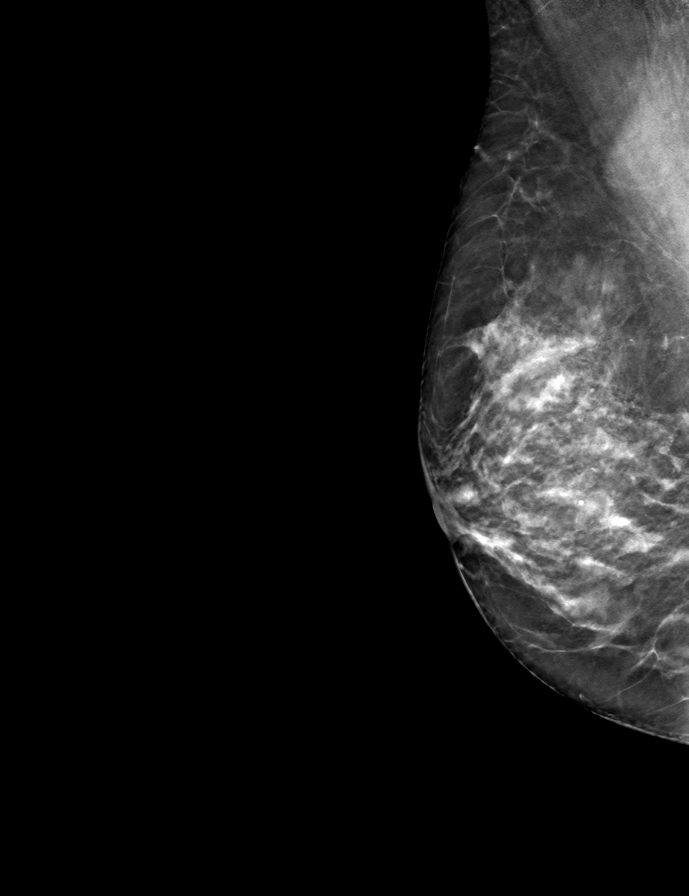

[L CC tomo · tomo slice 34/67.0]
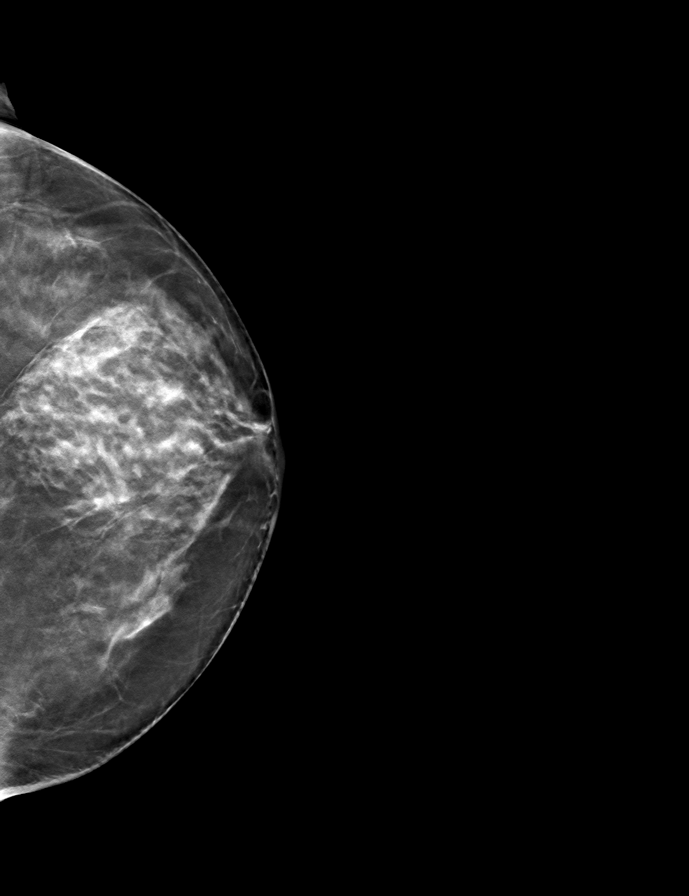

[L MLO tomo · tomo slice 30/59.0]
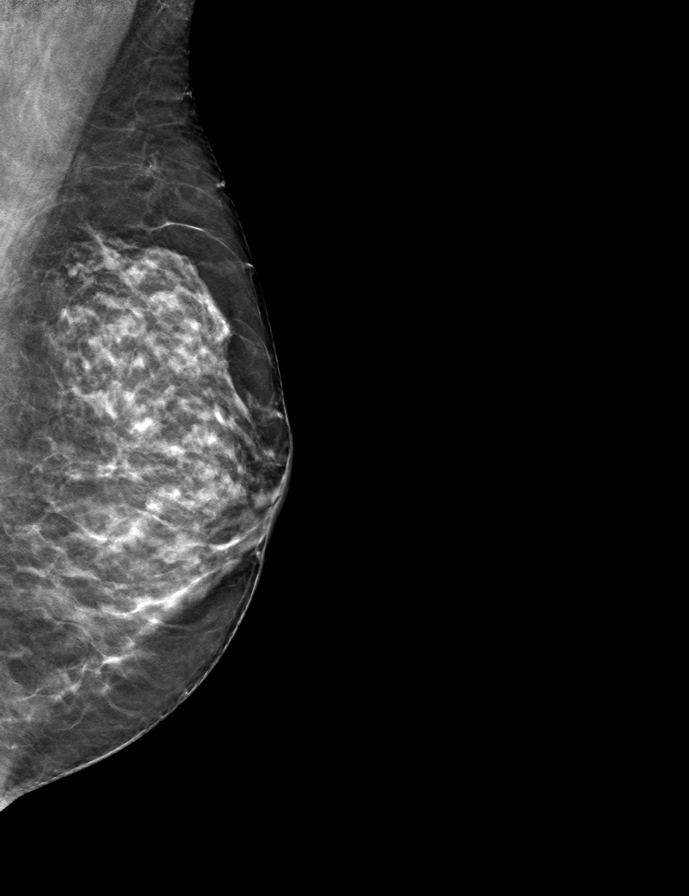

[R CC tomo · tomo slice 33/65.0]
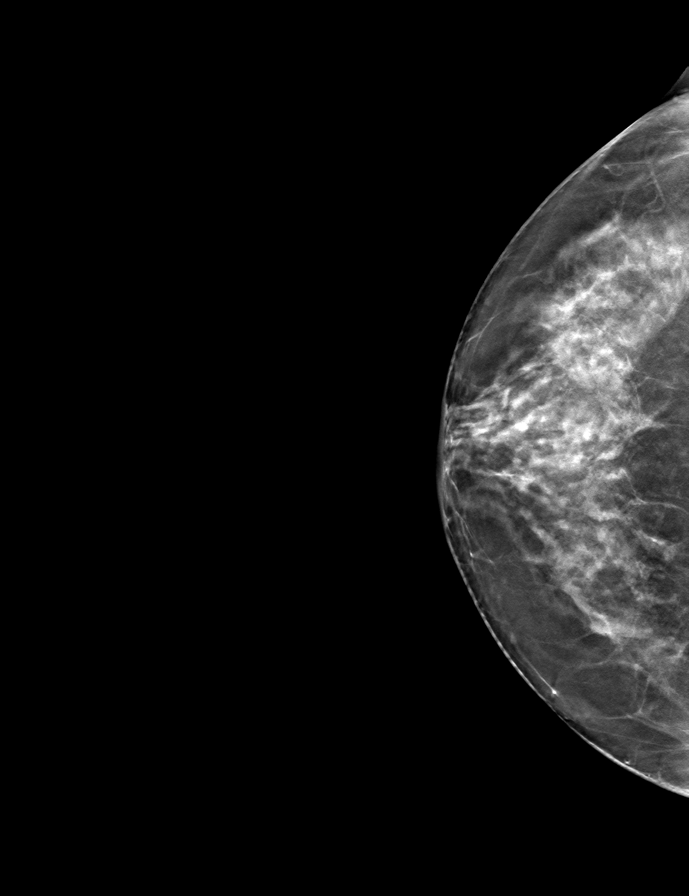

[9 of 24 positions shown; findings below may reference images not displayed]

ACR Breast Density Category c: The breast tissue is heterogeneously
dense, which may obscure small masses.
FINDINGS: There are no findings suspicious for malignancy. The images were
evaluated with computer-aided detection.
IMPRESSION: No mammographic evidence of malignancy. A result letter of this
screening mammogram will be mailed directly to the patient.

RECOMMENDATION:
Screening mammogram in one year. (Code:T4-5-GWO)

BI-RADS CATEGORY  1: Negative.

## 2022-11-23 NOTE — Assessment & Plan Note (Signed)
Lab Results  Component Value Date   LDLCALC 111 (H) 11/03/2022   Uncontrolled, goal ldl < 100, pt to continue current low chol diet, declines statin

## 2022-11-23 NOTE — Progress Notes (Signed)
Patient ID: [name], female   DOB: 24-May-1956, 67 y.o.   MRN: [id number]        Chief Complaint: follow up HLD, dysuria, low vit d, low back pain       HPI:  [name] is a 67 y.o. female here with c/o 2-3 wks mild dysuria but Denies urinary symptoms such as frequency, urgency, flank pain, hematuria or n/v, fever, chills.  Did take 3 doses cephalexin but had abd pains seemed worse, so stopped.  Pt denies chest pain, increased sob or doe, wheezing, orthopnea, PND, increased LE swelling, palpitations, dizziness or syncope.   Pt denies polydipsia, polyuria, or new focal neuro s/s.    Pt denies other wt loss, night sweats, loss of appetite, or other constitutional symptoms  Does not want statin today, trying to follow lower chol diet. Taking Vit D.  Pt continues to have recurring right LBP mild intermittent, but no bowel or bladder change, fever, wt loss,  worsening LE pain/numbness/weakness, gait change or falls. Worse to stand up.         Wt Readings from Last 3 Encounters:  11/23/22 143 lb (64.9 kg)  06/23/22 141 lb (64 kg)  11/03/21 145 lb 9.6 oz (66 kg)   BP Readings from Last 3 Encounters:  11/23/22 108/60  06/23/22 100/62  11/03/21 118/62         Past Medical History:  Diagnosis Date   ASCUS with positive high risk HPV 09/2004   CIN I (cervical intraepithelial neoplasia I) 2005   Condyloma    COVID-19 virus infection 11/2020   Fibroids    GERD (gastroesophageal reflux disease)    Kidney stone    Past Surgical History:  Procedure Laterality Date   CERVICAL BIOPSY  W/ LOOP ELECTRODE EXCISION     CIN 3-Clear margins    MYOMECTOMY  1991   TOTAL ABDOMINAL HYSTERECTOMY  08/27/2008   Aborted R TLH-TAH ov retained; fibroids recurrent CIN, path-fibroids, CIN I    reports that she has never smoked. She has never used smokeless tobacco. She reports that she does not currently use alcohol. She reports that she does not use drugs. family history includes Breast cancer in her  maternal aunt, maternal aunt, maternal aunt, paternal aunt, and paternal grandmother; Breast cancer (age of onset: 49) in her sister; Congestive Heart Failure in her father; Hypertension in her mother. No Known Allergies Current Outpatient Medications on File Prior to Visit  Medication Sig Dispense Refill   cephALEXin (KEFLEX) 500 MG capsule Take 1 capsule (500 mg total) by mouth 3 (three) times daily. 30 capsule 0   chlorhexidine (PERIDEX) 0.12 % solution SMARTSIG:By Mouth     Multiple Vitamin (MULTIVITAMIN) tablet Take 1 tablet by mouth daily.     Multiple Vitamins-Minerals (PRESERVISION AREDS) TABS See admin instructions.     triamcinolone (KENALOG) 0.025 % ointment Apply 1 Application topically 2 (two) times daily. Uses as needed. 30 g 1   VITAMIN D PO Take by mouth.     Cal Carb-Mag Hydrox-Simeth (MYLANTA COAT & COOL) 1200-270-80 MG/10ML SUSP Orally prn     Cholecalciferol 50 MCG (2000 UT) TABS 1 tablet Orally Once a day for 30 day(s)     famotidine (PEPCID) 20 MG tablet 1 tablet at bedtime Orally Once a day-prn     Multiple Vitamin (MULTIVITAMIN) capsule 1 tablet Orally Once a day     Zinc 100 MG TABS 1 tablet (Patient not taking: Reported on 11/23/2022)  No current facility-administered medications on file prior to visit.        ROS:  All others reviewed and negative.  Objective        PE:  BP 108/60 (BP Location: Left Arm, Patient Position: Sitting, Cuff Size: Large)   Pulse 80   Temp 98.6 F (37 C) (Oral)   Ht 5' 6.5" (1.689 m)   Wt 143 lb (64.9 kg)   LMP 11/16/2007 (Within Months)   SpO2 96%   BMI 22.74 kg/m                 Constitutional: Pt appears in NAD               HENT: Head: NCAT.                Right Ear: External ear normal.                 Left Ear: External ear normal.                Eyes: . Pupils are equal, round, and reactive to light. Conjunctivae and EOM are normal               Nose: without d/c or deformity               Neck: Neck supple. Gross  normal ROM               Cardiovascular: Normal rate and regular rhythm.                 Pulmonary/Chest: Effort normal and breath sounds without rales or wheezing.                Abd:  Soft, NT, ND, + BS, no organomegaly               Neurological: Pt is alert. At baseline orientation, motor grossly intact               Skin: Skin is warm. No rashes, no other new lesions, LE edema - none               Psychiatric: Pt behavior is normal without agitation   Micro: none  Cardiac tracings I have personally interpreted today:  none  Pertinent Radiological findings (summarize): none   Lab Results  Component Value Date   WBC 5.3 11/03/2022   HGB 13.7 11/03/2022   HCT 40.6 11/03/2022   PLT 250.0 11/03/2022   GLUCOSE 92 11/03/2022   CHOL 208 (H) 11/03/2022   TRIG 88.0 11/03/2022   HDL 79.00 11/03/2022   LDLCALC 111 (H) 11/03/2022   ALT 11 11/03/2022   AST 19 11/03/2022   NA 139 11/03/2022   K 4.2 11/03/2022   CL 102 11/03/2022   CREATININE 0.97 11/03/2022   BUN 12 11/03/2022   CO2 29 11/03/2022   TSH 2.54 11/03/2022   HGBA1C 5.3 11/03/2022   Assessment/Plan:  [name] is a 67 y.o. White or Caucasian [1] female with  has a past medical history of ASCUS with positive high risk HPV (09/2004), CIN I (cervical intraepithelial neoplasia I) (2005), Condyloma, COVID-19 virus infection (11/2020), Fibroids, GERD (gastroesophageal reflux disease), and Kidney stone.  Vitamin D deficiency Last vitamin D Lab Results  Component Value Date   VD25OH 67.00 11/03/2022   Stable, cont oral replacement   Dysuria Etiology unclear, for UA and culture today,  Lower back pain C/w msk strain recurrent, for tylenol prn  HLD (hyperlipidemia) Lab Results  Component Value Date   LDLCALC 111 (H) 11/03/2022   Uncontrolled, goal ldl < 100, pt to continue current low chol diet, declines statin  Followup: No follow-ups on file.  Cathlean Cower, MD 11/23/2022 12:51 PM [facility]

## 2022-11-23 NOTE — Assessment & Plan Note (Signed)
Last vitamin D Lab Results  Component Value Date   VD25OH 67.00 11/03/2022   Stable, cont oral replacement

## 2022-11-23 NOTE — Assessment & Plan Note (Signed)
Etiology unclear, for UA and culture today,

## 2022-11-23 NOTE — Patient Instructions (Signed)
Please continue all other medications as before, and refills have been done if requested.  Please have the pharmacy call with any other refills you may need.  Please continue your efforts at being more active, low cholesterol diet, and weight control.  Please keep your appointments with your specialists as you may have planned  Please go to the LAB at the blood drawing area for the tests to be done  You will be contacted by phone if any changes need to be made immediately.  Otherwise, you will receive a letter about your results with an explanation, but please check with MyChart first.  Please remember to sign up for MyChart if you have not done so, as this will be important to you in the future with finding out test results, communicating by private email, and scheduling acute appointments online when needed.  Please make an Appointment to return for your 1 year visit, or sooner if needed

## 2022-11-23 NOTE — Assessment & Plan Note (Signed)
C/w msk strain recurrent, for tylenol prn

## 2022-11-24 LAB — URINE CULTURE: Result:: NO GROWTH

## 2023-01-14 ENCOUNTER — Other Ambulatory Visit: Payer: Self-pay | Admitting: Obstetrics and Gynecology

## 2023-01-14 DIAGNOSIS — Z1231 Encounter for screening mammogram for malignant neoplasm of breast: Secondary | ICD-10-CM

## 2023-03-08 ENCOUNTER — Ambulatory Visit
Admission: RE | Admit: 2023-03-08 | Discharge: 2023-03-08 | Disposition: A | Discharge status: Home / Self Care | Payer: TRICARE For Life (TFL) | Source: Ambulatory Visit | Attending: Obstetrics and Gynecology | Admitting: Obstetrics and Gynecology

## 2023-03-08 DIAGNOSIS — Z1231 Encounter for screening mammogram for malignant neoplasm of breast: Secondary | ICD-10-CM

## 2023-04-12 IMAGING — US US FNA BIOPSY THYROID 1ST LESION
1 series · 13 of 13 positions shown · non-contrast
Comparison: US Thyroid 05/11/21

MEDICATIONS:
5 cc 1% lidocaine

COMPLICATIONS:
None immediate.

INDICATION: Left superior thyroid nodule

1.6 cm
EXAM:
ULTRASOUND GUIDED FINE NEEDLE ASPIRATION OF INDETERMINATE THYROID
NODULE
TECHNIQUE: Informed written consent was obtained from the patient after a
discussion of the risks, benefits and alternatives to treatment.
Questions regarding the procedure were encouraged and answered. A
timeout was performed prior to the initiation of the procedure.

[Series 1: us fna biopsy thyroid 1st lesion · 0.06mm/px · 13 acquisitions, 13 frames shown]
[im 1/13]
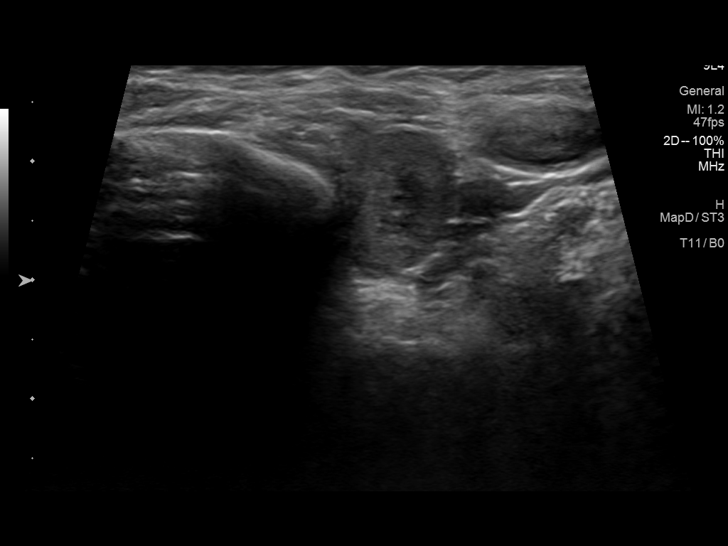
[im 2/13]
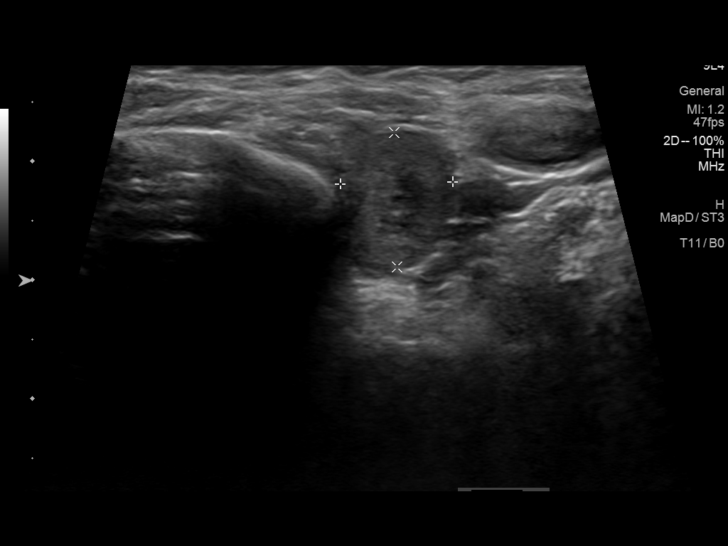
[im 3/13]
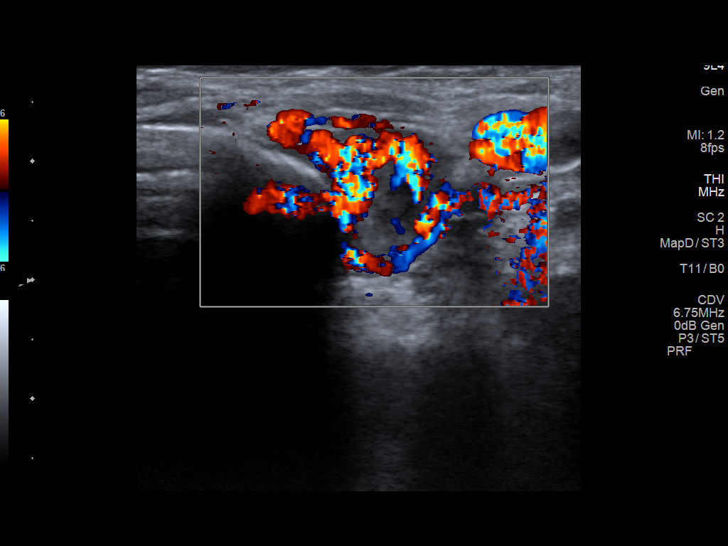
[im 4/13]
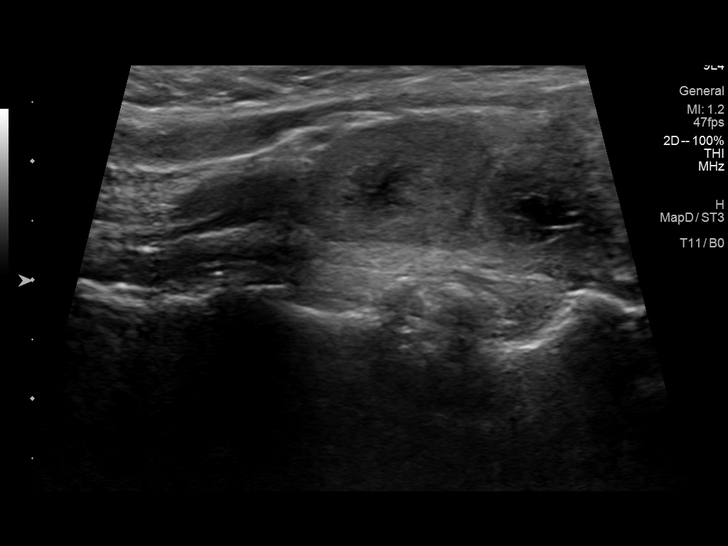
[im 5/13]
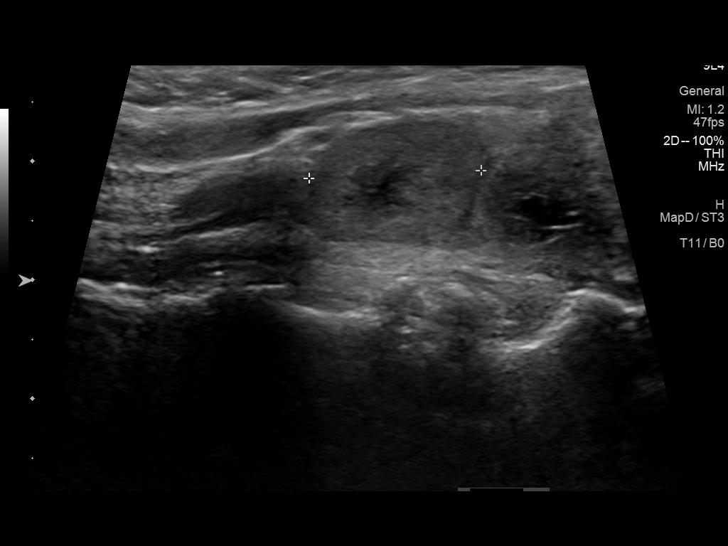
[im 6/13]
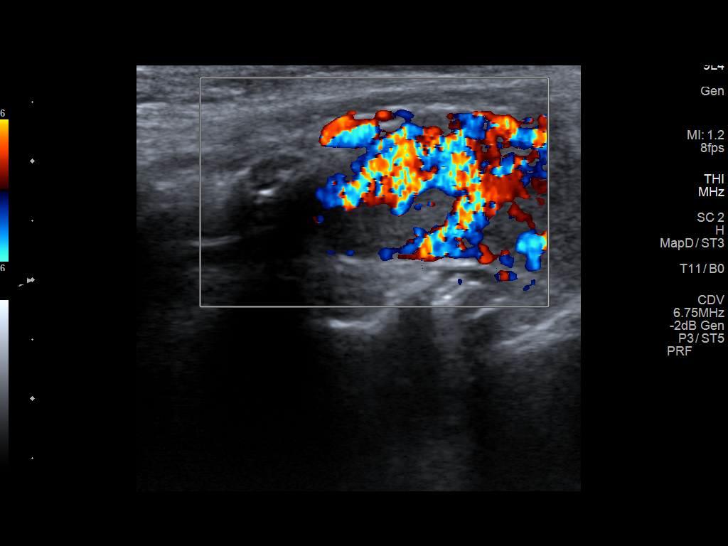
[im 7/13]
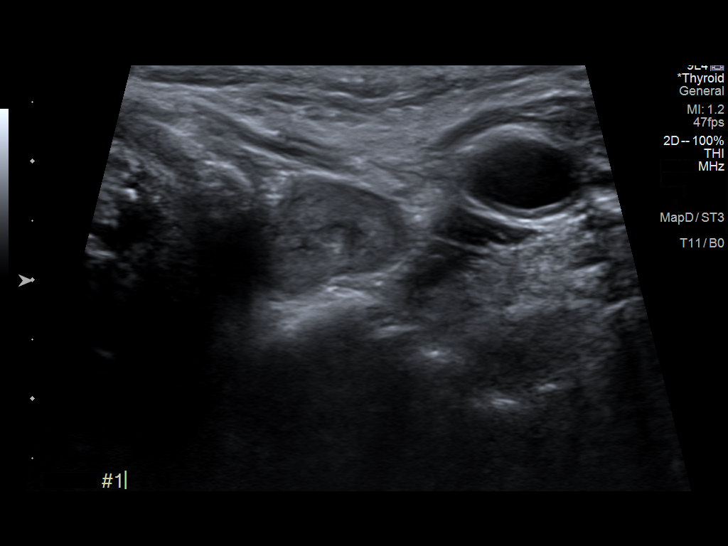
[im 8/13]
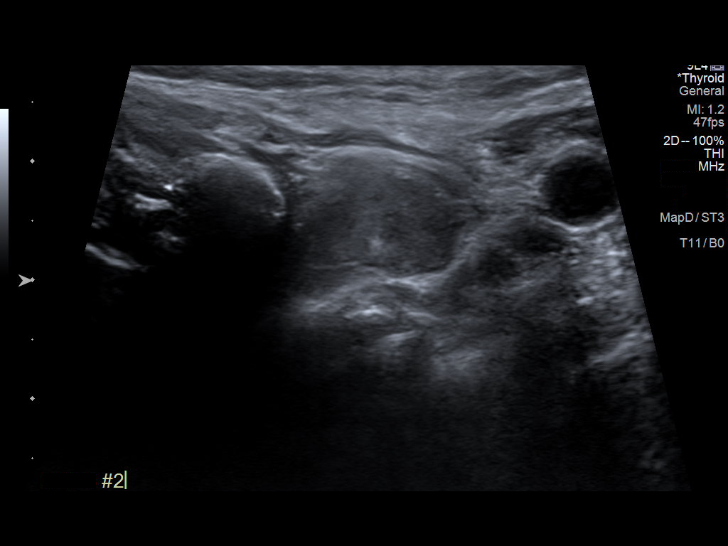
[im 9/13]
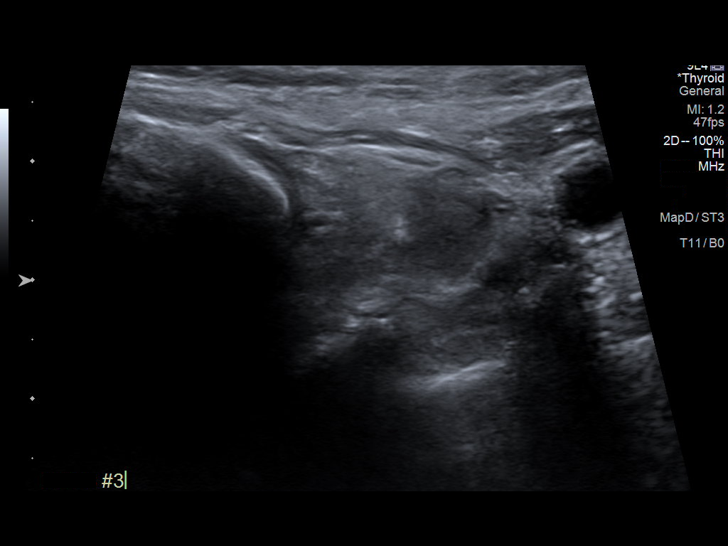
[im 10/13]
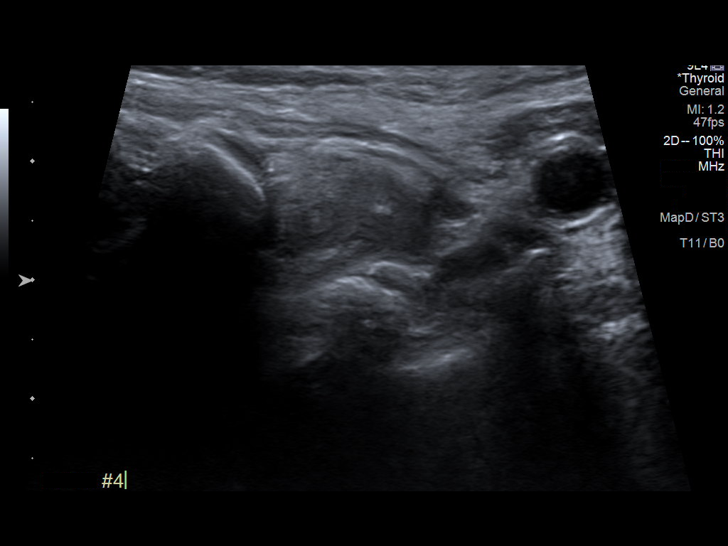
[im 11/13]
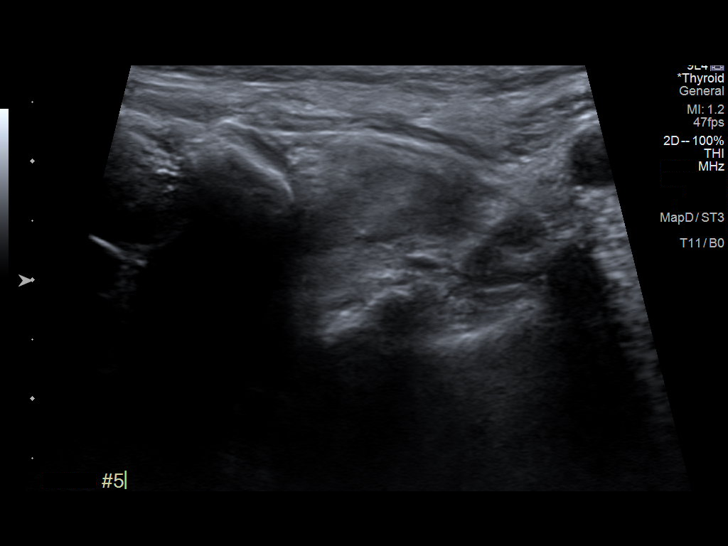
[im 12/13]
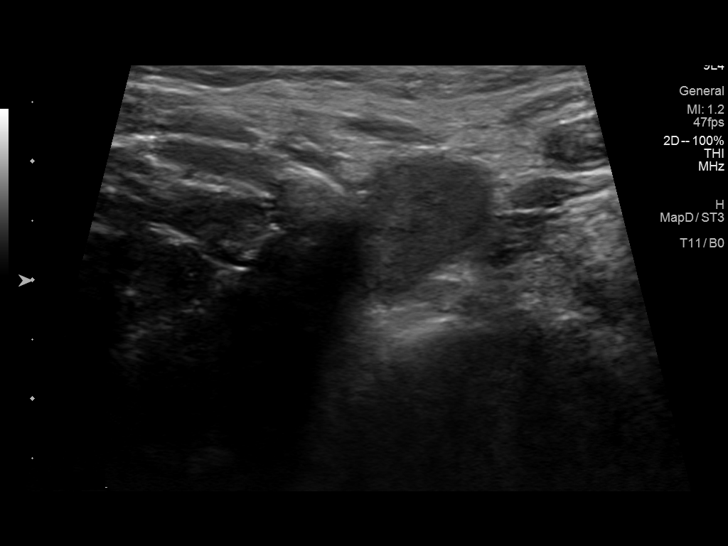
[im 13/13]
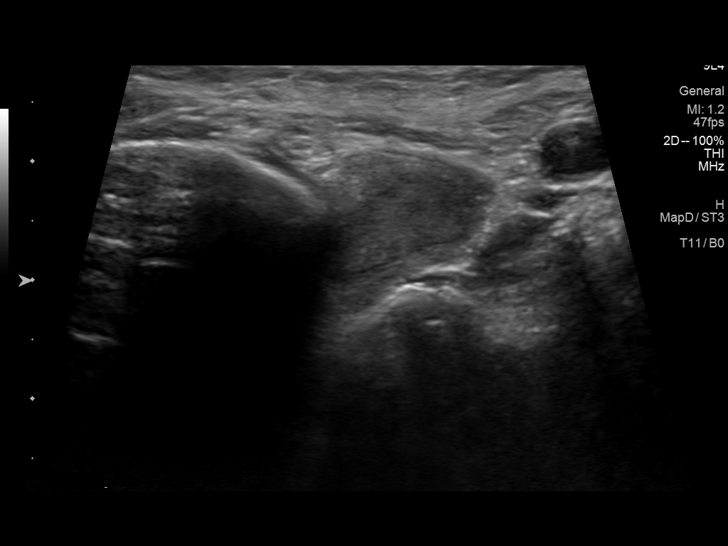

[13 of 13 positions shown; findings below may reference images not displayed]

Pre-procedural ultrasound scanning demonstrated unchanged size and
appearance of the indeterminate nodule within the left thyroid

The procedure was planned. The neck was prepped in the usual sterile
fashion, and a sterile drape was applied covering the operative
field. A timeout was performed prior to the initiation of the
procedure. Local anesthesia was provided with 1% lidocaine.

Under direct ultrasound guidance, 5 FNA biopsies were performed of
the left superior thyroid nodule with a 27 gauge needle.

2 of these samples were obtained for AFIRMA.

Multiple ultrasound images were saved for procedural documentation
purposes. The samples were prepared and submitted to pathology.

Limited post procedural scanning was negative for hematoma or
additional complication. Dressings were placed. The patient
tolerated the above procedures procedure well without immediate
postprocedural complication.
FINDINGS: Nodule reference number based on prior diagnostic ultrasound: 2

Maximum size: 1.6 cm

Location: Left; Superior

ACR TI-RADS risk category: TR4 (4-6 points)

Reason for biopsy: meets ACR TI-RADS criteria

Ultrasound imaging confirms appropriate placement of the needles
within the thyroid nodule.
IMPRESSION: Technically successful ultrasound guided fine needle aspiration of
left superior thyroid nodule

Read by

Anchen Mroza

## 2023-04-20 ENCOUNTER — Ambulatory Visit
Admission: RE | Admit: 2023-04-20 | Discharge: 2023-04-20 | Disposition: A | Discharge status: Home / Self Care | Payer: Medicare Other | Source: Ambulatory Visit | Attending: Endocrinology | Admitting: Endocrinology

## 2023-04-20 DIAGNOSIS — E049 Nontoxic goiter, unspecified: Secondary | ICD-10-CM

## 2023-11-21 ENCOUNTER — Telehealth: Payer: Self-pay | Admitting: Internal Medicine

## 2023-11-21 NOTE — Telephone Encounter (Signed)
 Very sorry, this is not normally allowed as the primary insurance appears to be traditional medicare which does not pay for labs done prior to visit, thanks

## 2023-11-21 NOTE — Telephone Encounter (Signed)
 Copied from CRM [id number]. Topic: Appointments - Appointment Scheduling >> Nov 21, 2023  3:45 PM [name] wrote: Pt called and stated that she will like to have her labs done for her physical a week before. Please order labs for pt.

## 2023-11-22 NOTE — Telephone Encounter (Signed)
Called and left voicemail, letting Pt know.

## 2023-11-24 ENCOUNTER — Other Ambulatory Visit: Payer: Medicare Other

## 2023-11-28 ENCOUNTER — Other Ambulatory Visit: Payer: Medicare Other

## 2023-11-29 ENCOUNTER — Ambulatory Visit: Payer: Medicare Other | Admitting: Internal Medicine

## 2023-12-05 ENCOUNTER — Ambulatory Visit: Payer: Medicare Other | Admitting: Internal Medicine

## 2023-12-20 ENCOUNTER — Encounter: Payer: Self-pay | Admitting: Internal Medicine

## 2023-12-20 ENCOUNTER — Telehealth: Payer: Self-pay

## 2023-12-20 ENCOUNTER — Ambulatory Visit: Payer: Medicare Other | Admitting: Internal Medicine

## 2023-12-20 VITALS — BP 118/68 | HR 72 | Temp 98.5°F | Ht 66.5 in | Wt 145.0 lb

## 2023-12-20 DIAGNOSIS — K5730 Diverticulosis of large intestine without perforation or abscess without bleeding: Secondary | ICD-10-CM | POA: Insufficient documentation

## 2023-12-20 DIAGNOSIS — K219 Gastro-esophageal reflux disease without esophagitis: Secondary | ICD-10-CM

## 2023-12-20 DIAGNOSIS — E041 Nontoxic single thyroid nodule: Secondary | ICD-10-CM

## 2023-12-20 DIAGNOSIS — R739 Hyperglycemia, unspecified: Secondary | ICD-10-CM | POA: Diagnosis not present

## 2023-12-20 DIAGNOSIS — E538 Deficiency of other specified B group vitamins: Secondary | ICD-10-CM | POA: Diagnosis not present

## 2023-12-20 DIAGNOSIS — E785 Hyperlipidemia, unspecified: Secondary | ICD-10-CM | POA: Diagnosis not present

## 2023-12-20 DIAGNOSIS — E559 Vitamin D deficiency, unspecified: Secondary | ICD-10-CM

## 2023-12-20 LAB — HEPATIC FUNCTION PANEL
ALT: 11 U/L (ref 0–35)
AST: 19 U/L (ref 0–37)
Albumin: 4.2 g/dL (ref 3.5–5.2)
Alkaline Phosphatase: 76 U/L (ref 39–117)
Bilirubin, Direct: 0.1 mg/dL (ref 0.0–0.3)
Total Bilirubin: 0.7 mg/dL (ref 0.2–1.2)
Total Protein: 6.9 g/dL (ref 6.0–8.3)

## 2023-12-20 LAB — LIPID PANEL
Cholesterol: 219 mg/dL — ABNORMAL HIGH (ref 0–200)
HDL: 82.2 mg/dL (ref >39.00)
LDL Cholesterol: 122 mg/dL — ABNORMAL HIGH (ref 0–99)
NonHDL: 136.5
Total CHOL/HDL Ratio: 3
Triglycerides: 73 mg/dL (ref 0.0–149.0)
VLDL: 14.6 mg/dL (ref 0.0–40.0)

## 2023-12-20 LAB — CBC WITH DIFFERENTIAL/PLATELET
Basophils Absolute: 0 10*3/uL (ref 0.0–0.1)
Basophils Relative: 0.8 % (ref 0.0–3.0)
Eosinophils Absolute: 0.1 10*3/uL (ref 0.0–0.7)
Eosinophils Relative: 1.9 % (ref 0.0–5.0)
HCT: 42.3 % (ref 36.0–46.0)
Hemoglobin: 13.9 g/dL (ref 12.0–15.0)
Lymphocytes Relative: 19.5 % (ref 12.0–46.0)
Lymphs Abs: 1 10*3/uL (ref 0.7–4.0)
MCHC: 32.9 g/dL (ref 30.0–36.0)
MCV: 93.5 fL (ref 78.0–100.0)
Monocytes Absolute: 0.6 10*3/uL (ref 0.1–1.0)
Monocytes Relative: 10.9 % (ref 3.0–12.0)
Neutro Abs: 3.4 10*3/uL (ref 1.4–7.7)
Neutrophils Relative %: 66.9 % (ref 43.0–77.0)
Platelets: 234 10*3/uL (ref 150.0–400.0)
RBC: 4.53 Mil/uL (ref 3.87–5.11)
RDW: 13.8 % (ref 11.5–15.5)
WBC: 5.1 10*3/uL (ref 4.0–10.5)

## 2023-12-20 LAB — URINALYSIS, ROUTINE W REFLEX MICROSCOPIC
Bilirubin Urine: NEGATIVE
Ketones, ur: NEGATIVE
Nitrite: NEGATIVE
Specific Gravity, Urine: 1.02 (ref 1.000–1.030)
Total Protein, Urine: NEGATIVE
Urine Glucose: NEGATIVE
Urobilinogen, UA: 0.2 (ref 0.0–1.0)
pH: 6 (ref 5.0–8.0)

## 2023-12-20 LAB — VITAMIN B12: Vitamin B-12: 377 pg/mL (ref 211–911)

## 2023-12-20 LAB — HEMOGLOBIN A1C: Hgb A1c MFr Bld: 5.3 % (ref 4.6–6.5)

## 2023-12-20 LAB — BASIC METABOLIC PANEL
BUN: 18 mg/dL (ref 6–23)
CO2: 28 meq/L (ref 19–32)
Calcium: 9 mg/dL (ref 8.4–10.5)
Chloride: 105 meq/L (ref 96–112)
Creatinine, Ser: 0.86 mg/dL (ref 0.40–1.20)
GFR: 70.35 mL/min (ref >60.00)
Glucose, Bld: 79 mg/dL (ref 70–99)
Potassium: 4.1 meq/L (ref 3.5–5.1)
Sodium: 140 meq/L (ref 135–145)

## 2023-12-20 LAB — VITAMIN D 25 HYDROXY (VIT D DEFICIENCY, FRACTURES): VITD: 101.6 ng/mL (ref 30.00–100.00)

## 2023-12-20 LAB — TSH: TSH: 1.84 u[IU]/mL (ref 0.35–5.50)

## 2023-12-20 NOTE — Telephone Encounter (Signed)
Ok to let pt know to reduce the Vit D to 5 days per week only, thanks

## 2023-12-20 NOTE — Assessment & Plan Note (Signed)
Lab Results  Component Value Date   LDLCALC 111 (H) 11/03/2022   Uncontrolled, for f/u lab today, low chol diet, consider  Statin but declines for now

## 2023-12-20 NOTE — Assessment & Plan Note (Signed)
Stable, cont to f/u with endo

## 2023-12-20 NOTE — Assessment & Plan Note (Signed)
Mild recurrent, declines PPI for now, continue diet and otc pepcid prn

## 2023-12-20 NOTE — Patient Instructions (Signed)

## 2023-12-20 NOTE — Assessment & Plan Note (Signed)
Last vitamin D Lab Results  Component Value Date   VD25OH 67.00 11/03/2022   Stable, cont oral replacement

## 2023-12-20 NOTE — Progress Notes (Signed)
 Patient ID: [name], female   DOB: [birth year], 68 y.o.   MRN: [id number]         Chief Complaint:: yearly exam       HPI:  [name] is a 68 y.o. female here                  Did have recent URI bronchitis late nov with some persistent cough and congestion lingering.  Also has ongoing reflux she normally controls with diet and occasional pepcid .  Pt denies chest pain, increased sob or doe, wheezing, orthopnea, PND, increased LE swelling, palpitations, dizziness or syncope.   Pt denies polydipsia, polyuria, or new focal neuro s/s.   Pt denies fever, wt loss, night sweats, loss of appetite, or other constitutional symptoms   Wt Readings from Last 3 Encounters:  12/20/23 145 lb (65.8 kg)  11/23/22 143 lb (64.9 kg)  06/23/22 141 lb (64 kg)   BP Readings from Last 3 Encounters:  12/20/23 118/68  11/23/22 108/60  06/23/22 100/62   Immunization History  Administered Date(s) Administered   Influenza,inj,Quad PF,6+ Mos 07/19/2019   Moderna Sars-Covid-2 Vaccination 01/26/2020, 02/26/2020   PNEUMOCOCCAL CONJUGATE-20 12/06/2022   Tdap 05/02/2019   Zoster Recombinant(Shingrix) 07/20/2019, 11/03/2019   Health Maintenance Due  Topic Date Due   Medicare Annual Wellness (AWV)  Never done      Past Medical History:  Diagnosis Date   ASCUS with positive high risk HPV 09/2004   CIN I (cervical intraepithelial neoplasia I) 2005   Condyloma    COVID-19 virus infection 11/2020   Fibroids    GERD (gastroesophageal reflux disease)    Kidney stone    Past Surgical History:  Procedure Laterality Date   CERVICAL BIOPSY  W/ LOOP ELECTRODE EXCISION     CIN 3-Clear margins    MYOMECTOMY  1991   TOTAL ABDOMINAL HYSTERECTOMY  08/27/2008   Aborted R TLH-TAH ov retained; fibroids recurrent CIN, path-fibroids, CIN I    reports that she has never smoked. She has never used smokeless tobacco. She reports that she does not currently use alcohol. She reports that she does not use  drugs. family history includes Breast cancer in her maternal aunt, maternal aunt, maternal aunt, paternal aunt, and paternal grandmother; Breast cancer (age of onset: 13) in her sister; Congestive Heart Failure in her father; Hypertension in her mother. No Known Allergies Current Outpatient Medications on File Prior to Visit  Medication Sig Dispense Refill   Cal Carb-Mag Hydrox-Simeth (MYLANTA COAT & COOL) 1200-270-80 MG/10ML SUSP Orally prn     chlorhexidine (PERIDEX) 0.12 % solution SMARTSIG:By Mouth     Cholecalciferol 50 MCG (2000 UT) TABS 1 tablet Orally Once a day for 30 day(s)     famotidine  (PEPCID ) 20 MG tablet 1 tablet at bedtime Orally Once a day-prn     Multiple Vitamin (MULTIVITAMIN) capsule 1 tablet Orally Once a day     Multiple Vitamin (MULTIVITAMIN) tablet Take 1 tablet by mouth daily.     Multiple Vitamins-Minerals (PRESERVISION AREDS) TABS See admin instructions.     triamcinolone  (KENALOG ) 0.025 % ointment Apply 1 Application topically 2 (two) times daily. Uses as needed. 30 g 1   VITAMIN D  PO Take by mouth.     cephALEXin  (KEFLEX ) 500 MG capsule Take 1 capsule (500 mg total) by mouth 3 (three) times daily. (Patient not taking: Reported on 12/20/2023) 30 capsule 0   Zinc 100 MG TABS 1 tablet (Patient not taking: Reported on 12/20/2023)  No current facility-administered medications on file prior to visit.        ROS:  All others reviewed and negative.  Objective        PE:  BP 118/68 (BP Location: Right Arm, Patient Position: Sitting, Cuff Size: Normal)   Pulse 72   Temp 98.5 F (36.9 C) (Oral)   Ht 5' 6.5 (1.689 m)   Wt 145 lb (65.8 kg)   LMP 11/16/2007 (Within Months)   SpO2 99%   BMI 23.05 kg/m                 Constitutional: Pt appears in NAD               HENT: Head: NCAT.                Right Ear: External ear normal.                 Left Ear: External ear normal.                Eyes: . Pupils are equal, round, and reactive to light. Conjunctivae and EOM  are normal               Nose: without d/c or deformity               Neck: Neck supple. Gross normal ROM, right lower thyroid  nodule nontender               Cardiovascular: Normal rate and regular rhythm.                 Pulmonary/Chest: Effort normal and breath sounds without rales or wheezing.                Abd:  Soft, NT, ND, + BS, no organomegaly               Neurological: Pt is alert. At baseline orientation, motor grossly intact               Skin: Skin is warm. No rashes, no other new lesions, LE edema - none               Psychiatric: Pt behavior is normal without agitation   Micro: none  Cardiac tracings I have personally interpreted today:  none  Pertinent Radiological findings (summarize): none   Lab Results  Component Value Date   WBC 5.3 11/03/2022   HGB 13.7 11/03/2022   HCT 40.6 11/03/2022   PLT 250.0 11/03/2022   GLUCOSE 92 11/03/2022   CHOL 208 (H) 11/03/2022   TRIG 88.0 11/03/2022   HDL 79.00 11/03/2022   LDLCALC 111 (H) 11/03/2022   ALT 11 11/03/2022   AST 19 11/03/2022   NA 139 11/03/2022   K 4.2 11/03/2022   CL 102 11/03/2022   CREATININE 0.97 11/03/2022   BUN 12 11/03/2022   CO2 29 11/03/2022   TSH 2.54 11/03/2022   HGBA1C 5.3 11/03/2022   Assessment/Plan:  [name] is a 68 y.o. White or Caucasian [1] female with  has a past medical history of ASCUS with positive high risk HPV (09/2004), CIN I (cervical intraepithelial neoplasia I) (2005), Condyloma, COVID-19 virus infection (11/2020), Fibroids, GERD (gastroesophageal reflux disease), and Kidney stone.  GERD Mild recurrent, declines PPI for now, continue diet and otc pepcid  prn  HLD (hyperlipidemia) Lab Results  Component Value Date   LDLCALC 111 (H) 11/03/2022   Uncontrolled, for f/u lab today, low chol diet, consider  Statin but declines for now  Vitamin D  deficiency Last vitamin D  Lab Results  Component Value Date   VD25OH 67.00 11/03/2022   Stable, cont oral  replacement   Thyroid  nodule Stable, cont to f/u with endo  Followup: Return in about 1 year (around 12/19/2024).  Lynwood Rush, MD 12/20/2023 1:02 PM [facility]

## 2023-12-21 ENCOUNTER — Encounter: Payer: Self-pay | Admitting: Internal Medicine

## 2023-12-21 ENCOUNTER — Other Ambulatory Visit: Payer: Self-pay | Admitting: Internal Medicine

## 2023-12-22 NOTE — Telephone Encounter (Signed)
 Called and let Pt know

## 2024-02-01 ENCOUNTER — Ambulatory Visit (INDEPENDENT_AMBULATORY_CARE_PROVIDER_SITE_OTHER): Payer: Medicare Other

## 2024-02-01 VITALS — BP 110/62 | HR 81 | Ht 65.5 in | Wt 148.0 lb

## 2024-02-01 DIAGNOSIS — Z0000 Encounter for general adult medical examination without abnormal findings: Secondary | ICD-10-CM

## 2024-02-01 NOTE — Progress Notes (Signed)
 Subjective:   [name] is a 68 y.o. who presents for a Medicare Wellness preventive visit.  Visit Complete: In person  Persons Participating in Visit: Patient.  AWV Questionnaire: No: Patient Medicare AWV questionnaire was not completed prior to this visit.  Cardiac Risk Factors include: advanced age (>64men, >52 women)     Objective:    Today's Vitals   02/01/24 1354  BP: 110/62  Pulse: 81  SpO2: 98%  Weight: 148 lb (67.1 kg)  Height: 5' 5.5" (1.664 m)   Body mass index is 24.25 kg/m.     02/01/2024    1:52 PM 01/13/2020   10:36 PM  Advanced Directives  Does Patient Have a Medical Advance Directive? No No  Would patient like information on creating a medical advance directive? Yes (MAU/Ambulatory/Procedural Areas - Information given) No - Patient declined    Current Medications (verified) Outpatient Encounter Medications as of 02/01/2024  Medication Sig   Cal Carb-Mag Hydrox-Simeth (MYLANTA COAT & COOL) 1200-270-80 MG/10ML SUSP Orally prn   chlorhexidine (PERIDEX) 0.12 % solution SMARTSIG:By Mouth   Cholecalciferol 50 MCG (2000 UT) TABS 1 tablet Orally Once a day for 30 day(s)   famotidine (PEPCID) 20 MG tablet 1 tablet at bedtime Orally Once a day-prn   Multiple Vitamin (MULTIVITAMIN) capsule 1 tablet Orally Once a day   Multiple Vitamin (MULTIVITAMIN) tablet Take 1 tablet by mouth daily.   Multiple Vitamins-Minerals (PRESERVISION AREDS) TABS See admin instructions.   triamcinolone (KENALOG) 0.025 % ointment Apply 1 Application topically 2 (two) times daily. Uses as needed.   VITAMIN D PO Take by mouth.   Zinc 100 MG TABS    No facility-administered encounter medications on file as of 02/01/2024.    Allergies (verified) Patient has no known allergies.   History: Past Medical History:  Diagnosis Date   ASCUS with positive high risk HPV 09/2004   CIN I (cervical intraepithelial neoplasia I) 2005   Condyloma    COVID-19 virus infection 11/2020    Fibroids    GERD (gastroesophageal reflux disease)    Kidney stone    Past Surgical History:  Procedure Laterality Date   CERVICAL BIOPSY  W/ LOOP ELECTRODE EXCISION     CIN 3-Clear margins    MYOMECTOMY  1991   TOTAL ABDOMINAL HYSTERECTOMY  08/27/2008   Aborted R TLH-TAH ov retained; fibroids recurrent CIN, path-fibroids, CIN I   Family History  Problem Relation Age of Onset   Congestive Heart Failure Father    Hypertension Mother    Breast cancer Sister 34       Dx'd 03/2016   Breast cancer Maternal Aunt    Breast cancer Paternal Aunt    Breast cancer Paternal Grandmother    Breast cancer Maternal Aunt    Breast cancer Maternal Aunt    Social History   Socioeconomic History   Marital status: Married    Spouse name: Not on file   Number of children: 0   Years of education: Not on file   Highest education level: Not on file  Occupational History   Occupation: SOFTWARE SUPPORT   Occupation: SOFTWARE SUPPORT    Employer: [employer]  Tobacco Use   Smoking status: Never    Passive exposure: Never   Smokeless tobacco: Never  Vaping Use   Vaping status: Never Used  Substance and Sexual Activity   Alcohol use: Not Currently    Alcohol/week: 1.0 standard drink of alcohol    Types: 1 Glasses of wine per  week    Comment: rare   Drug use: No   Sexual activity: Not Currently    Partners: Male    Birth control/protection: Surgical    Comment: Hysterectomy  Other Topics Concern   Not on file  Social History Narrative   No siblings with heart problems. Works in Company secretary.   Social Drivers of Corporate investment banker Strain: Low Risk  (02/01/2024)   Overall Financial Resource Strain (CARDIA)    Difficulty of Paying Living Expenses: Not hard at all  Food Insecurity: No Food Insecurity (02/01/2024)   Hunger Vital Sign    Worried About Running Out of Food in the Last Year: Never true    Ran Out of Food in the Last Year: Never true  Transportation  Needs: No Transportation Needs (02/01/2024)   PRAPARE - Administrator, Civil Service (Medical): No    Lack of Transportation (Non-Medical): No  Physical Activity: Sufficiently Active (02/01/2024)   Exercise Vital Sign    Days of Exercise per Week: 5 days    Minutes of Exercise per Session: 30 min  Stress: No Stress Concern Present (02/01/2024)   [employer] of Occupational Health - Occupational Stress Questionnaire    Feeling of Stress : Not at all  Social Connections: Socially Integrated (02/01/2024)   Social Connection and Isolation Panel [NHANES]    Frequency of Communication with Friends and Family: More than three times a week    Frequency of Social Gatherings with Friends and Family: More than three times a week    Attends Religious Services: More than 4 times per year    Active Member of [employer] or Organizations: Yes    Attends Engineer, structural: More than 4 times per year    Marital Status: Married    Tobacco Counseling Counseling given: No    Clinical Intake:  Pre-visit preparation completed: Yes  Pain : No/denies pain     BMI - recorded: 24.25 Nutritional Status: BMI of 19-24  Normal Nutritional Risks: None Diabetes: No  Lab Results  Component Value Date   HGBA1C 5.3 12/20/2023   HGBA1C 5.3 11/03/2022   HGBA1C 5.4 11/03/2021     How often do you need to have someone help you when you read instructions, pamphlets, or other written materials from your doctor or pharmacy?: 1 - Never  Interpreter Needed?: No  Information entered by :: Hassell Halim, CMA   Activities of Daily Living     02/01/2024    1:57 PM  In your present state of health, do you have any difficulty performing the following activities:  Hearing? 0  Vision? 0  Difficulty concentrating or making decisions? 0  Walking or climbing stairs? 0  Dressing or bathing? 0  Doing errands, shopping? 0  Preparing Food and eating ? N  Using the Toilet? N  In the past six  months, have you accidently leaked urine? Y  Comment slight - no need for a pad  Do you have problems with loss of bowel control? N  Managing your Medications? N  Managing your Finances? N  Housekeeping or managing your Housekeeping? N    Patient Care Team: Corwin Levins, MD as PCP - General (Internal Medicine) Patton Salles, MD as Consulting Physician (Obstetrics and Gynecology) [facility], [place] (Ophthalmology)  Indicate any recent Medical Services you may have received from other than [facility] providers in the past year (date may be approximate).  Assessment:   This is a routine wellness examination for Nyaira.  Hearing/Vision screen Hearing Screening - Comments:: Denies hearing difficulties   Vision Screening - Comments:: Wears rx glasses - up to date with routine eye exams with [facility]   Goals Addressed               This Visit's Progress     Patient Stated (pt-stated)        Patient stated she plans to continue exercising.       Depression Screen     02/01/2024    2:00 PM 12/20/2023    9:39 AM 11/23/2022   11:22 AM 11/23/2022   10:59 AM 11/03/2021   10:33 AM 11/03/2021   10:13 AM 10/30/2020   10:37 AM  PHQ 2/9 Scores  PHQ - 2 Score 0 0 0 0 0 0 0  PHQ- 9 Score 0          Fall Risk     02/01/2024    1:58 PM 12/20/2023    9:44 AM 11/23/2022   11:22 AM 11/23/2022   10:59 AM 11/03/2021   10:33 AM  Fall Risk   Falls in the past year? 0 0 0 0 0  Number falls in past yr: 0 0 0  0  Injury with Fall? 0 0 0 0 0  Risk for fall due to : No Fall Risks No Fall Risks  No Fall Risks   Follow up Falls prevention discussed;Falls evaluation completed Falls evaluation completed  Falls evaluation completed     MEDICARE RISK AT HOME:  Medicare Risk at Home Any stairs in or around the home?: No If so, are there any without handrails?: No Home free of loose throw rugs in walkways, pet beds, electrical cords, etc?: Yes Adequate lighting in your home to  reduce risk of falls?: Yes Life alert?: No Use of a cane, walker or w/c?: No Grab bars in the bathroom?: No Shower chair or bench in shower?: No Elevated toilet seat or a handicapped toilet?: Yes  TIMED UP AND GO:  Was the test performed?  No  Cognitive Function: 6CIT completed        02/01/2024    2:01 PM  6CIT Screen  What Year? 0 points  What month? 0 points  What time? 0 points  Count back from 20 0 points  Months in reverse 0 points  Repeat phrase 0 points  Total Score 0 points    Immunizations Immunization History  Administered Date(s) Administered   Influenza,inj,Quad PF,6+ Mos 07/19/2019   Moderna Sars-Covid-2 Vaccination 01/26/2020, 02/26/2020   PNEUMOCOCCAL CONJUGATE-20 12/06/2022   Tdap 05/02/2019   Zoster Recombinant(Shingrix) 07/20/2019, 11/03/2019    Screening Tests Health Maintenance  Topic Date Due   COVID-19 Vaccine (3 - 2024-25 season) 07/17/2023   INFLUENZA VACCINE  02/13/2024 (Originally 06/16/2023)   Medicare Annual Wellness (AWV)  01/31/2025   MAMMOGRAM  03/07/2025   Colonoscopy  01/18/2029   DTaP/Tdap/Td (2 - Td or Tdap) 05/01/2029   Pneumonia Vaccine 52+ Years old  Completed   DEXA SCAN  Completed   Hepatitis C Screening  Completed   Zoster Vaccines- Shingrix  Completed   HPV VACCINES  Aged Out    Health Maintenance  Health Maintenance Due  Topic Date Due   COVID-19 Vaccine (3 - 2024-25 season) 07/17/2023   Health Maintenance Items Addressed: 02/01/2024  Additional Screening:  Vision Screening: Recommended annual ophthalmology exams for early detection of glaucoma and other disorders of the  eye. Pt stated she has annual eye exams with [facility].  Dental Screening: Recommended annual dental exams for proper oral hygiene  Community Resource Referral / Chronic Care Management: CRR required this visit?  No   CCM required this visit?  No     Plan:     I have personally reviewed and noted the following in the patient's  chart:   Medical and social history Use of alcohol, tobacco or illicit drugs  Current medications and supplements including opioid prescriptions. Patient is not currently taking opioid prescriptions. Functional ability and status Nutritional status Physical activity Advanced directives List of other physicians Hospitalizations, surgeries, and ER visits in previous 12 months Vitals Screenings to include cognitive, depression, and falls Referrals and appointments  In addition, I have reviewed and discussed with patient certain preventive protocols, quality metrics, and best practice recommendations. A written personalized care plan for preventive services as well as general preventive health recommendations were provided to patient.     Darreld Mclean, CMA   02/01/2024   After Visit Summary: (MyChart) Due to this being a telephonic visit, the after visit summary with patients personalized plan was offered to patient via MyChart   Notes: Nothing significant to report at this time.

## 2024-02-01 NOTE — Patient Instructions (Signed)
 Ms. [name] , Thank you for taking time to come for your Medicare Wellness Visit. I appreciate your ongoing commitment to your health goals. Please review the following plan we discussed and let me know if I can assist you in the future.   Referrals/Orders/Follow-Ups/Clinician Recommendations: Aim for 30 minutes of exercise or brisk walking, 6-8 glasses of water, and 5 servings of fruits and vegetables each day.   This is a list of the screening recommended for you and due dates:  Health Maintenance  Topic Date Due   COVID-19 Vaccine (3 - 2024-25 season) 07/17/2023   Flu Shot  02/13/2024*   Medicare Annual Wellness Visit  01/31/2025   Mammogram  03/07/2025   Colon Cancer Screening  01/18/2029   DTaP/Tdap/Td vaccine (2 - Td or Tdap) 05/01/2029   Pneumonia Vaccine  Completed   DEXA scan (bone density measurement)  Completed   Hepatitis C Screening  Completed   Zoster (Shingles) Vaccine  Completed   HPV Vaccine  Aged Out  *Topic was postponed. The date shown is not the original due date.    Advanced directives: (Copy Requested) Please bring a copy of your health care power of attorney and living will to the office to be added to your chart at your convenience. You can mail to [facility] [address] [place], [place] [address] or email to [email or web address]  Next Medicare Annual Wellness Visit scheduled for next year: Yes

## 2024-02-06 ENCOUNTER — Other Ambulatory Visit: Payer: Self-pay | Admitting: Obstetrics and Gynecology

## 2024-02-06 DIAGNOSIS — Z0000 Encounter for general adult medical examination without abnormal findings: Secondary | ICD-10-CM

## 2024-03-13 ENCOUNTER — Ambulatory Visit
Admission: RE | Admit: 2024-03-13 | Discharge: 2024-03-13 | Disposition: A | Discharge status: Home / Self Care | Source: Ambulatory Visit | Attending: Obstetrics and Gynecology | Admitting: Obstetrics and Gynecology

## 2024-03-13 DIAGNOSIS — Z0000 Encounter for general adult medical examination without abnormal findings: Secondary | ICD-10-CM

## 2024-03-19 ENCOUNTER — Encounter: Payer: Self-pay | Admitting: Obstetrics and Gynecology

## 2024-04-30 ENCOUNTER — Other Ambulatory Visit: Payer: Self-pay | Admitting: Endocrinology

## 2024-04-30 DIAGNOSIS — E049 Nontoxic goiter, unspecified: Secondary | ICD-10-CM

## 2024-05-22 ENCOUNTER — Ambulatory Visit
Admission: RE | Admit: 2024-05-22 | Discharge: 2024-05-22 | Disposition: A | Discharge status: Home / Self Care | Source: Ambulatory Visit | Attending: Endocrinology | Admitting: Endocrinology

## 2024-05-22 DIAGNOSIS — E049 Nontoxic goiter, unspecified: Secondary | ICD-10-CM

## 2024-06-01 ENCOUNTER — Other Ambulatory Visit: Payer: Self-pay | Admitting: Endocrinology

## 2024-06-01 DIAGNOSIS — E049 Nontoxic goiter, unspecified: Secondary | ICD-10-CM

## 2024-06-08 ENCOUNTER — Other Ambulatory Visit (HOSPITAL_COMMUNITY)
Admission: RE | Admit: 2024-06-08 | Discharge: 2024-06-08 | Disposition: A | Discharge status: Home / Self Care | Source: Ambulatory Visit | Attending: Endocrinology | Admitting: Endocrinology

## 2024-06-08 ENCOUNTER — Ambulatory Visit
Admission: RE | Admit: 2024-06-08 | Discharge: 2024-06-08 | Disposition: A | Discharge status: Home / Self Care | Source: Ambulatory Visit | Attending: Endocrinology | Admitting: Endocrinology

## 2024-06-08 DIAGNOSIS — E049 Nontoxic goiter, unspecified: Secondary | ICD-10-CM

## 2024-06-12 LAB — CYTOLOGY - NON PAP

## 2024-06-15 ENCOUNTER — Other Ambulatory Visit

## 2024-06-18 NOTE — Progress Notes (Unsigned)
 68 y.o. G0P0000 Married Caucasian non-Hispanic female here for breast and pelvic exam.    Patient is followed for vulvar irritation and lichen simplex chronicus.  Uses Kenalog  as needed.   Would like a refill.  Has increased itching.  Wants a pap done.    Enjoying retirement.   PCP: Norleen Lynwood ORN, MD   Patient's last menstrual period was 11/16/2007 (within months).           Sexually active: No.  The current method of family planning is post menopausal status.    Menopausal hormone therapy:  none  Exercising: Yes.    Fitness walking  Smoker:  no  OB History  Gravida Para Term Preterm AB Living  0 0 0 0 0 0  SAB IAB Ectopic Multiple Live Births  0 0 0 0      HEALTH MAINTENANCE: Last 2 paps:  06/23/22 normal, neg HR HPV, 05/14/20 normal, neg HR HPV History of abnormal Pap or positive HPV:  no Mammogram:   03/13/24 Bi-rads 1 neg density C  Colonoscopy:  01/19/2019 - due in 2030.   Bone Density:  09/05/2019  Result  normal    Immunization History  Administered Date(s) Administered   Influenza,inj,Quad PF,6+ Mos 07/19/2019   Moderna Sars-Covid-2 Vaccination 01/26/2020, 02/26/2020   PNEUMOCOCCAL CONJUGATE-20 12/06/2022   Tdap 05/02/2019   Zoster Recombinant(Shingrix) 07/20/2019, 11/03/2019      reports that she has never smoked. She has never been exposed to tobacco smoke. She has never used smokeless tobacco. She reports that she does not currently use alcohol after a past usage of about 1.0 standard drink of alcohol per week. She reports that she does not use drugs.  Past Medical History:  Diagnosis Date   ASCUS with positive high risk HPV 09/2004   CIN I (cervical intraepithelial neoplasia I) 2005   Condyloma    COVID-19 virus infection 11/2020   Fibroids    GERD (gastroesophageal reflux disease)    Kidney stone     Past Surgical History:  Procedure Laterality Date   CERVICAL BIOPSY  W/ LOOP ELECTRODE EXCISION     CIN 3-Clear margins    MYOMECTOMY  1991    TOTAL ABDOMINAL HYSTERECTOMY  08/27/2008   Aborted R TLH-TAH ov retained; fibroids recurrent CIN, path-fibroids, CIN I    Current Outpatient Medications  Medication Sig Dispense Refill   Cal Carb-Mag Hydrox-Simeth (MYLANTA COAT & COOL) 1200-270-80 MG/10ML SUSP Orally prn     chlorhexidine (PERIDEX) 0.12 % solution SMARTSIG:By Mouth     Cholecalciferol 50 MCG (2000 UT) TABS 1 tablet Orally Once a day for 30 day(s)     famotidine  (PEPCID ) 20 MG tablet 1 tablet at bedtime Orally Once a day-prn     Multiple Vitamin (MULTIVITAMIN) capsule 1 tablet Orally Once a day     Multiple Vitamin (MULTIVITAMIN) tablet Take 1 tablet by mouth daily.     Multiple Vitamins-Minerals (PRESERVISION AREDS) TABS See admin instructions.     triamcinolone  (KENALOG ) 0.025 % ointment Apply 1 Application topically 2 (two) times daily. Uses as needed. 30 g 1   Zinc 100 MG TABS      VITAMIN D  PO Take by mouth.     No current facility-administered medications for this visit.    ALLERGIES: Patient has no known allergies.  Family History  Problem Relation Age of Onset   Congestive Heart Failure Father    Hypertension Mother    Breast cancer Sister 54  Dx'd 03/2016   Breast cancer Maternal Aunt    Breast cancer Paternal Aunt    Breast cancer Paternal Grandmother    Breast cancer Maternal Aunt    Breast cancer Maternal Aunt     Review of Systems  All other systems reviewed and are negative.   PHYSICAL EXAM:  BP 128/64   Pulse 75   Ht 5' 7 (1.702 m)   Wt 140 lb (63.5 kg)   LMP 11/16/2007 (Within Months)   SpO2 100%   BMI 21.93 kg/m     General appearance: alert, cooperative and appears stated age Head: normocephalic, without obvious abnormality, atraumatic Neck: no adenopathy, supple, symmetrical, trachea midline and thyroid  normal to inspection and palpation Lungs: clear to auscultation bilaterally Breasts: normal appearance, no masses or tenderness, No nipple retraction or dimpling, No nipple  discharge or bleeding, No axillary adenopathy Heart: regular rate and rhythm Abdomen: soft, non-tender; no masses, no organomegaly Extremities: extremities normal, atraumatic, no cyanosis or edema Skin: skin color, texture, turgor normal. No rashes or lesions Lymph nodes: cervical, supraclavicular, and axillary nodes normal. Neurologic: grossly normal  Pelvic: External genitalia:  no lesions              No abnormal inguinal nodes palpated.              Urethra:  normal appearing urethra with no masses, tenderness or lesions              Bartholins and Skenes: normal                 Vagina: normal appearing vagina with normal color and discharge, no lesions              Cervix: absent              Pap taken: yes Bimanual Exam:  Uterus:  absent              Adnexa: no mass, fullness, tenderness              Rectal exam: yes.  Confirms.              Anus:  normal sphincter tone, no lesions  Chaperone was present for exam:  Heinz HERO, CMA  ASSESSMENT: Well woman visit with gynecologic exam. Status post robotic TLH, 2009.  Final pathology - CIN I of cervix.  Ovaries retained.  Hx VAIN I with normal colposcopy.  Hx prior CIN III on LEEP likely between 2005 and 2009.  Negative HR HPV status on multiple paps following this.  Vaginal cancer screening.  Pigmentation of the perineum.  Status post benign biopsy.   Hx chronic vulvutis.  Uses Kenalog  prn.  FH breast cancer.  Sister tested negative for genetic mutations.  Enlarged thryroid.  Status post thyroid  biopsy.   Vaginal atrophy.  Hx renal stones.   PLAN: Mammogram screening discussed. Self breast awareness reviewed. Pap and reflex HRV collected:  yes Guidelines for Calcium, Vitamin D , regular exercise program including cardiovascular and weight bearing exercise. Medication refills:  Kenalog .  Declines vaginal estrogen or Intrarosa.  Encounter for medication monitoring.  Labs with PCP.  Follow up:  2 years and prn.    Additional  counseling given.  yes. 20 min  total time was spent for this patient encounter, including preparation, face-to-face counseling with the patient, coordination of care, and documentation of the encounter in addition to doing the well woman visit with gynecologic exam.

## 2024-06-25 ENCOUNTER — Other Ambulatory Visit (HOSPITAL_COMMUNITY)
Admission: RE | Admit: 2024-06-25 | Discharge: 2024-06-25 | Disposition: A | Discharge status: Home / Self Care | Source: Ambulatory Visit | Attending: Obstetrics and Gynecology | Admitting: Obstetrics and Gynecology

## 2024-06-25 ENCOUNTER — Ambulatory Visit (INDEPENDENT_AMBULATORY_CARE_PROVIDER_SITE_OTHER): Admitting: Obstetrics and Gynecology

## 2024-06-25 VITALS — BP 128/64 | HR 75 | Ht 67.0 in | Wt 140.0 lb

## 2024-06-25 DIAGNOSIS — N763 Subacute and chronic vulvitis: Secondary | ICD-10-CM

## 2024-06-25 DIAGNOSIS — Z1272 Encounter for screening for malignant neoplasm of vagina: Secondary | ICD-10-CM

## 2024-06-25 DIAGNOSIS — Z5181 Encounter for therapeutic drug level monitoring: Secondary | ICD-10-CM

## 2024-06-25 DIAGNOSIS — Z124 Encounter for screening for malignant neoplasm of cervix: Secondary | ICD-10-CM | POA: Insufficient documentation

## 2024-06-25 DIAGNOSIS — Z01419 Encounter for gynecological examination (general) (routine) without abnormal findings: Secondary | ICD-10-CM | POA: Diagnosis not present

## 2024-06-25 MED ORDER — TRIAMCINOLONE ACETONIDE 0.025 % EX OINT: 1.0000 | TOPICAL_OINTMENT | Freq: Two times a day (BID) | CUTANEOUS | 1 refills | Status: AC

## 2024-06-25 NOTE — Patient Instructions (Signed)

## 2024-06-26 ENCOUNTER — Encounter: Payer: Self-pay | Admitting: Obstetrics and Gynecology

## 2024-06-27 LAB — CYTOLOGY - PAP: Diagnosis: NEGATIVE

## 2024-06-29 ENCOUNTER — Ambulatory Visit: Payer: Self-pay | Admitting: Obstetrics and Gynecology

## 2024-11-30 ENCOUNTER — Telehealth: Payer: Self-pay

## 2024-11-30 NOTE — Telephone Encounter (Signed)
 Copied from CRM [id number]. Topic: Appointments - Appointment Scheduling >> Nov 30, 2024  2:42 PM Viola F wrote: Patient called to cancel physical with Dr. Norleen due to him leaving the practice. I scheduled TOC for 05/07/25 - explained that physical won't be done at this time since it's to establish care with provider. She wants to know does she have to wait until after June for physical?? She says that's a long time since she's due in February. Please call her at  [phone]

## 2024-12-05 NOTE — Telephone Encounter (Signed)
 Pt was calling back to follow up on message. Please call pt [phone] with updates.

## 2024-12-25 ENCOUNTER — Ambulatory Visit: Payer: Medicare Other | Admitting: Internal Medicine

## 2025-02-06 ENCOUNTER — Ambulatory Visit

## 2025-05-07 ENCOUNTER — Encounter: Admitting: Family Medicine
